# Patient Record
Sex: Female | Born: 1975
Health system: Southern US, Community
[De-identification: ages and names within clinical notes are randomized; demographics above are authoritative.]

## PROBLEM LIST (undated history)

## (undated) DIAGNOSIS — K219 Gastro-esophageal reflux disease without esophagitis: Secondary | ICD-10-CM

## (undated) DIAGNOSIS — G43909 Migraine, unspecified, not intractable, without status migrainosus: Secondary | ICD-10-CM

## (undated) DIAGNOSIS — K640 First degree hemorrhoids: Secondary | ICD-10-CM

## (undated) DIAGNOSIS — A4902 Methicillin resistant Staphylococcus aureus infection, unspecified site: Secondary | ICD-10-CM

## (undated) DIAGNOSIS — D649 Anemia, unspecified: Secondary | ICD-10-CM

## (undated) DIAGNOSIS — B9689 Other specified bacterial agents as the cause of diseases classified elsewhere: Secondary | ICD-10-CM

## (undated) DIAGNOSIS — F419 Anxiety disorder, unspecified: Secondary | ICD-10-CM

## (undated) DIAGNOSIS — F633 Trichotillomania: Secondary | ICD-10-CM

## (undated) DIAGNOSIS — N760 Acute vaginitis: Secondary | ICD-10-CM

## (undated) DIAGNOSIS — I10 Essential (primary) hypertension: Secondary | ICD-10-CM

## (undated) DIAGNOSIS — A6000 Herpesviral infection of urogenital system, unspecified: Secondary | ICD-10-CM

## (undated) HISTORY — DX: Essential (primary) hypertension: I10

## (undated) HISTORY — DX: Acute vaginitis: N76.0

## (undated) HISTORY — DX: Migraine, unspecified, not intractable, without status migrainosus: G43.909

## (undated) HISTORY — PX: TUBAL LIGATION: SHX77

## (undated) HISTORY — DX: Trichotillomania: F63.3

## (undated) HISTORY — DX: Other specified bacterial agents as the cause of diseases classified elsewhere: B96.89

## (undated) HISTORY — DX: First degree hemorrhoids: K64.0

## (undated) HISTORY — PX: LEEP: SHX91

## (undated) HISTORY — DX: Herpesviral infection of urogenital system, unspecified: A60.00

## (undated) HISTORY — DX: Anemia, unspecified: D64.9

---

## 2006-11-03 ENCOUNTER — Ambulatory Visit: Payer: Self-pay | Admitting: Obstetrics & Gynecology

## 2006-11-04 ENCOUNTER — Inpatient Hospital Stay: Payer: Self-pay | Admitting: Obstetrics & Gynecology

## 2006-11-04 DIAGNOSIS — O321XX0 Maternal care for breech presentation, not applicable or unspecified: Secondary | ICD-10-CM

## 2009-10-20 ENCOUNTER — Emergency Department: Payer: Self-pay

## 2010-04-09 ENCOUNTER — Observation Stay: Payer: Self-pay | Admitting: Obstetrics and Gynecology

## 2010-04-21 HISTORY — PX: TUBAL LIGATION: SHX77

## 2010-04-30 ENCOUNTER — Ambulatory Visit: Payer: Self-pay | Admitting: Obstetrics & Gynecology

## 2010-05-01 ENCOUNTER — Inpatient Hospital Stay: Payer: Self-pay | Admitting: Obstetrics & Gynecology

## 2010-05-02 LAB — PATHOLOGY REPORT

## 2013-12-21 ENCOUNTER — Emergency Department: Payer: Self-pay | Admitting: Emergency Medicine

## 2013-12-21 LAB — URINALYSIS, COMPLETE
Bilirubin,UR: NEGATIVE
Glucose,UR: NEGATIVE mg/dL (ref 0–75)
KETONE: NEGATIVE
NITRITE: POSITIVE
Ph: 7 (ref 4.5–8.0)
Protein: 100
Specific Gravity: 1.018 (ref 1.003–1.030)
Squamous Epithelial: 3
WBC UR: 139 /HPF (ref 0–5)

## 2013-12-21 LAB — PREGNANCY, URINE: PREGNANCY TEST, URINE: NEGATIVE m[IU]/mL

## 2014-01-25 ENCOUNTER — Emergency Department: Payer: Self-pay | Admitting: Emergency Medicine

## 2014-03-02 ENCOUNTER — Emergency Department: Payer: Self-pay | Admitting: Emergency Medicine

## 2016-04-04 ENCOUNTER — Emergency Department
Admission: EM | Admit: 2016-04-04 | Discharge: 2016-04-04 | Disposition: A | Discharge status: Home / Self Care | Payer: No Typology Code available for payment source | Attending: Emergency Medicine | Admitting: Emergency Medicine

## 2016-04-04 ENCOUNTER — Encounter: Payer: Self-pay | Admitting: Emergency Medicine

## 2016-04-04 ENCOUNTER — Emergency Department: Payer: No Typology Code available for payment source

## 2016-04-04 DIAGNOSIS — S0990XA Unspecified injury of head, initial encounter: Secondary | ICD-10-CM | POA: Diagnosis present

## 2016-04-04 DIAGNOSIS — Y999 Unspecified external cause status: Secondary | ICD-10-CM | POA: Insufficient documentation

## 2016-04-04 DIAGNOSIS — Y9389 Activity, other specified: Secondary | ICD-10-CM | POA: Insufficient documentation

## 2016-04-04 DIAGNOSIS — S0003XA Contusion of scalp, initial encounter: Secondary | ICD-10-CM | POA: Insufficient documentation

## 2016-04-04 DIAGNOSIS — S161XXA Strain of muscle, fascia and tendon at neck level, initial encounter: Secondary | ICD-10-CM | POA: Diagnosis not present

## 2016-04-04 DIAGNOSIS — S0093XA Contusion of unspecified part of head, initial encounter: Secondary | ICD-10-CM

## 2016-04-04 DIAGNOSIS — Y92410 Unspecified street and highway as the place of occurrence of the external cause: Secondary | ICD-10-CM | POA: Diagnosis not present

## 2016-04-04 MED ORDER — BACLOFEN 10 MG PO TABS: 10.0000 mg | ORAL_TABLET | Freq: Three times a day (TID) | ORAL | 0 refills | Status: DC

## 2016-04-04 MED ORDER — IBUPROFEN 800 MG PO TABS: 800.0000 mg | ORAL_TABLET | Freq: Three times a day (TID) | ORAL | 0 refills | Status: DC | PRN

## 2016-04-04 MED ORDER — DIAZEPAM 5 MG PO TABS
5.0000 mg | ORAL_TABLET | Freq: Once | ORAL | Status: AC
  Administered 2016-04-04: 5 mg via ORAL

## 2016-04-04 MED ORDER — DIAZEPAM 5 MG PO TABS
ORAL_TABLET | ORAL | Status: AC
  Filled 2016-04-04: qty 1

## 2016-04-04 NOTE — ED Notes (Signed)
Pt discharged home after verbalizing understanding of discharge instructions; nad noted. 
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
## 2016-04-04 NOTE — ED Triage Notes (Signed)
[place]  C/o neck pain and bilat shoulder pain.  c-collar applied.

## 2016-04-17 ENCOUNTER — Ambulatory Visit
Admission: EM | Admit: 2016-04-17 | Discharge: 2016-04-17 | Disposition: A | Discharge status: Home / Self Care | Payer: BLUE CROSS/BLUE SHIELD | Attending: Family Medicine | Admitting: Family Medicine

## 2016-04-17 DIAGNOSIS — S161XXA Strain of muscle, fascia and tendon at neck level, initial encounter: Secondary | ICD-10-CM

## 2016-04-17 DIAGNOSIS — M6248 Contracture of muscle, other site: Secondary | ICD-10-CM

## 2016-04-17 DIAGNOSIS — M62838 Other muscle spasm: Secondary | ICD-10-CM

## 2016-04-17 HISTORY — DX: Anxiety disorder, unspecified: F41.9

## 2016-04-17 MED ORDER — MELOXICAM 15 MG PO TABS: 15.0000 mg | ORAL_TABLET | Freq: Every day | ORAL | 0 refills | Status: DC

## 2016-04-17 MED ORDER — METAXALONE 800 MG PO TABS: 800.0000 mg | ORAL_TABLET | Freq: Three times a day (TID) | ORAL | 0 refills | Status: DC
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
## 2016-06-18 ENCOUNTER — Ambulatory Visit
Admission: EM | Admit: 2016-06-18 | Discharge: 2016-06-18 | Disposition: A | Discharge status: Home / Self Care | Payer: BLUE CROSS/BLUE SHIELD | Attending: Internal Medicine | Admitting: Internal Medicine

## 2016-06-18 DIAGNOSIS — M5412 Radiculopathy, cervical region: Secondary | ICD-10-CM | POA: Diagnosis not present

## 2016-06-18 MED ORDER — PREDNISONE 50 MG PO TABS: 50.0000 mg | ORAL_TABLET | Freq: Every day | ORAL | 0 refills | Status: DC

## 2016-06-18 MED ORDER — NAPROXEN 500 MG PO TABS: 500.0000 mg | ORAL_TABLET | Freq: Two times a day (BID) | ORAL | 0 refills | Status: DC

## 2016-06-18 NOTE — Discharge Instructions (Addendum)
Trial of prednisone (steroid) and naproxen (anti inflammatory) to help with inflammation/pain.  Physical therapy and neck traction may also be helpful.  If not improving in several days would make appointment with spine specialist for further evaluation.
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
## 2016-09-30 ENCOUNTER — Telehealth: Payer: Self-pay

## 2016-09-30 ENCOUNTER — Other Ambulatory Visit: Payer: Self-pay | Admitting: Certified Nurse Midwife

## 2016-09-30 DIAGNOSIS — A6000 Herpesviral infection of urogenital system, unspecified: Secondary | ICD-10-CM | POA: Insufficient documentation

## 2016-09-30 MED ORDER — VALACYCLOVIR HCL 500 MG PO TABS: 500.0000 mg | ORAL_TABLET | Freq: Two times a day (BID) | ORAL | 1 refills | Status: DC

## 2016-09-30 NOTE — Telephone Encounter (Signed)
Pt called stating she has an outbreak of herpes.  Her rx has expired.  [facility] [place]

## 2016-09-30 NOTE — Telephone Encounter (Signed)
I phoned in a RX for Valtrez 500 mgm BID x 3-5 days prn outbreak [facility] [place]

## 2016-10-16 ENCOUNTER — Telehealth: Payer: Self-pay

## 2016-10-16 DIAGNOSIS — N760 Acute vaginitis: Principal | ICD-10-CM

## 2016-10-16 DIAGNOSIS — B9689 Other specified bacterial agents as the cause of diseases classified elsewhere: Secondary | ICD-10-CM | POA: Insufficient documentation

## 2016-10-16 MED ORDER — METRONIDAZOLE 500 MG PO TABS: 500.0000 mg | ORAL_TABLET | Freq: Two times a day (BID) | ORAL | 0 refills | Status: AC

## 2016-10-16 NOTE — Telephone Encounter (Signed)
RN to notify pt Rx eRxd. F/u prn. Annual due if she wants to schedule (looking at May/June).

## 2016-10-16 NOTE — Telephone Encounter (Signed)
Pt calling c another reoccurring bacterial infection.  Can a rx be called in.  She states she is not able to come in today or tomorrow and with the holiday weekend doesn't want it to get worse.

## 2016-10-29 ENCOUNTER — Telehealth: Payer: Self-pay

## 2016-10-29 MED ORDER — SERTRALINE HCL 100 MG PO TABS: 100.0000 mg | ORAL_TABLET | Freq: Every day | ORAL | 0 refills | Status: DC

## 2016-10-29 NOTE — Telephone Encounter (Signed)
Pt has appt 11/22/16 with [facility]

## 2016-11-21 ENCOUNTER — Encounter: Payer: Self-pay | Admitting: Obstetrics and Gynecology

## 2016-11-22 ENCOUNTER — Other Ambulatory Visit: Payer: Self-pay | Admitting: Obstetrics and Gynecology

## 2016-11-22 ENCOUNTER — Telehealth: Payer: Self-pay

## 2016-11-22 ENCOUNTER — Ambulatory Visit (INDEPENDENT_AMBULATORY_CARE_PROVIDER_SITE_OTHER): Payer: BLUE CROSS/BLUE SHIELD | Admitting: Obstetrics and Gynecology

## 2016-11-22 ENCOUNTER — Encounter: Payer: Self-pay | Admitting: Obstetrics and Gynecology

## 2016-11-22 VITALS — BP 138/86 | HR 74 | Ht 65.5 in | Wt 187.0 lb

## 2016-11-22 DIAGNOSIS — Z01419 Encounter for gynecological examination (general) (routine) without abnormal findings: Secondary | ICD-10-CM

## 2016-11-22 DIAGNOSIS — F329 Major depressive disorder, single episode, unspecified: Secondary | ICD-10-CM

## 2016-11-22 DIAGNOSIS — F419 Anxiety disorder, unspecified: Secondary | ICD-10-CM | POA: Diagnosis not present

## 2016-11-22 DIAGNOSIS — Z1151 Encounter for screening for human papillomavirus (HPV): Secondary | ICD-10-CM

## 2016-11-22 DIAGNOSIS — Z124 Encounter for screening for malignant neoplasm of cervix: Secondary | ICD-10-CM

## 2016-11-22 DIAGNOSIS — K219 Gastro-esophageal reflux disease without esophagitis: Secondary | ICD-10-CM

## 2016-11-22 DIAGNOSIS — N760 Acute vaginitis: Secondary | ICD-10-CM | POA: Diagnosis not present

## 2016-11-22 DIAGNOSIS — Z113 Encounter for screening for infections with a predominantly sexual mode of transmission: Secondary | ICD-10-CM

## 2016-11-22 DIAGNOSIS — B9689 Other specified bacterial agents as the cause of diseases classified elsewhere: Secondary | ICD-10-CM | POA: Diagnosis not present

## 2016-11-22 DIAGNOSIS — F32A Depression, unspecified: Secondary | ICD-10-CM | POA: Insufficient documentation

## 2016-11-22 LAB — POCT WET PREP WITH KOH
CLUE CELLS WET PREP PER HPF POC: POSITIVE
KOH Prep POC: POSITIVE — AB
TRICHOMONAS UA: NEGATIVE
Yeast Wet Prep HPF POC: NEGATIVE

## 2016-11-22 MED ORDER — CLINDAMYCIN HCL 300 MG PO CAPS: 300.0000 mg | ORAL_CAPSULE | Freq: Two times a day (BID) | ORAL | 0 refills | Status: AC

## 2016-11-22 MED ORDER — CLINDAMYCIN PHOSPHATE 2 % VA CREA: 1.0000 | TOPICAL_CREAM | Freq: Every day | VAGINAL | 0 refills | Status: DC

## 2016-11-22 MED ORDER — SERTRALINE HCL 100 MG PO TABS: 100.0000 mg | ORAL_TABLET | Freq: Every day | ORAL | 12 refills | Status: DC

## 2016-11-22 MED ORDER — OMEPRAZOLE 20 MG PO CPDR: 20.0000 mg | DELAYED_RELEASE_CAPSULE | Freq: Every day | ORAL | 11 refills | Status: DC

## 2016-11-22 NOTE — Telephone Encounter (Signed)
Clindamycin oral meds eRxd. BID for 7 days. RN to notify pt. F/u if still too expensive.

## 2016-11-22 NOTE — Telephone Encounter (Signed)
Pt aware.

## 2016-11-22 NOTE — Telephone Encounter (Signed)
Pt was seen this am and rx was sent to Pharm that is going to cost $80 c ins.  Is there a pill form or something not as expensive.  [phone]

## 2016-11-22 NOTE — Progress Notes (Signed)
Chief Complaint  Patient presents with  . Gynecologic Exam    vaginal odor/ ?? BV     HPI:      [name] is a 41 y.o. [id number] who LMP was Patient's last menstrual period was 11/15/2016 (exact date)., presents today for her annual examination.  Her menses are regular every 28-30 days, lasting 7 days.  Dysmenorrhea mild, occurring first 1-2 days of flow. She does not have intermenstrual bleeding.  Sex activity: single partner, contraception - tubal ligation. She has a new partner this yr from last yr.  Last Pap: October 02, 2015  Results were: ASCUS with NEGATIVE high risk HPV  Hx of STDs: HSV. She had the first outbreak in a long time a couple months ago. She has a RF on valtrex.   She had a hx of recurrent BV in the past. Sx had resolved for over a yr, but pt is with sex partner now that she was with when she got recurrent sx in the past. She was not with him when her sx stopped. She was treated with flagyl for BV about a month ago. Pt states sx have recurred since LMP with increased d/c and odor.   There is no FH of breast cancer. There is no FH of ovarian cancer. The patient does not do self-breast exams.  Tobacco use: The patient denies current or previous tobacco use. Alcohol use: none Exercise: moderately active  She does get adequate calcium and Vitamin D in her diet.  She has noticed GERD sx recently. She has taken her dad's omeprazole with sx relief. She denies any triggers. Sx are daily. She would like a Rx.   She takes sertraline 100 mg daily for anxiety/OCD tendencies. Rx is helping with sx. She denies any side effects and wants to cont.   Active Ambulatory Problems    Diagnosis Date Noted  . Herpes genitalis 09/30/2016  . BV (bacterial vaginosis) 10/16/2016  . Anxiety    Resolved Ambulatory Problems    Diagnosis Date Noted  . No Resolved Ambulatory Problems   Past Medical History:  Diagnosis Date  . Anemia   . Anxiety   . BV (bacterial vaginosis)     . First degree hemorrhoids   . Herpes genitalis   . Migraine      Past Medical History:  Diagnosis Date  . Anemia   . Anxiety   . BV (bacterial vaginosis)   . First degree hemorrhoids   . Herpes genitalis   . Migraine     Past Surgical History:  Procedure Laterality Date  . CESAREAN SECTION    . LEEP    . TUBAL LIGATION      Family History  Problem Relation Age of Onset  . Diabetes Mother   . Gout Mother   . Hypertension Mother   . Hypertension Father   . Bone cancer Maternal Grandmother   . Diabetes Paternal Grandfather   . Heart disease Paternal Grandfather     Social History   Social History  . Marital status: Legally Separated    Spouse name: N/A  . Number of children: N/A  . Years of education: N/A   Occupational History  . Not on file.   Social History Main Topics  . Smoking status: Never Smoker  . Smokeless tobacco: Never Used  . Alcohol use No  . Drug use: No  . Sexual activity: Yes    Birth control/ protection: Surgical     Comment: BTL  Other Topics Concern  . Not on file   Social History Narrative  . No narrative on file     Current Outpatient Prescriptions:  .  sertraline (ZOLOFT) 100 MG tablet, Take 1 tablet (100 mg total) by mouth daily., Disp: 30 tablet, Rfl: 12 .  valACYclovir (VALTREX) 500 MG tablet, Take 1 tablet (500 mg total) by mouth 2 (two) times daily. For 3-5 days prn outbreak, Disp: 30 tablet, Rfl: 1 .  clindamycin (CLEOCIN) 2 % vaginal cream, Place 1 Applicatorful vaginally at bedtime., Disp: 40 g, Rfl: 0 .  omeprazole (PRILOSEC) 20 MG capsule, Take 1 capsule (20 mg total) by mouth daily., Disp: 30 capsule, Rfl: 11  ROS:  Review of Systems  Constitutional: Negative for fever, malaise/fatigue and weight loss.  HENT: Negative for congestion, ear pain and sinus pain.   Respiratory: Negative for cough, shortness of breath and wheezing.   Cardiovascular: Negative for chest pain, orthopnea and leg swelling.   Gastrointestinal: Positive for constipation. Negative for diarrhea, nausea and vomiting.  Genitourinary: Negative for dysuria, frequency, hematuria and urgency.       Breast ROS: negative   Musculoskeletal: Negative for back pain, joint pain and myalgias.  Skin: Negative for itching and rash.  Neurological: Negative for dizziness, tingling, focal weakness and headaches.  Endo/Heme/Allergies: Negative for environmental allergies. Does not bruise/bleed easily.  Psychiatric/Behavioral: Negative for depression and suicidal ideas. The patient is nervous/anxious. The patient does not have insomnia.     Objective: BP 138/86 (BP Location: Left Arm, Patient Position: Sitting, Cuff Size: Normal)   Pulse 74   Ht 5' 5.5" (1.664 m)   Wt 187 lb (84.8 kg)   LMP 11/15/2016 (Exact Date)   BMI 30.65 kg/m    Physical Exam  Constitutional: She is oriented to person, place, and time. She appears well-developed and well-nourished.  Genitourinary: Vagina normal and uterus normal. There is no rash or tenderness on the right labia. There is no rash or tenderness on the left labia. No erythema or tenderness in the vagina. No vaginal discharge found. Right adnexum does not display mass and does not display tenderness. Left adnexum does not display mass and does not display tenderness. Cervix does not exhibit motion tenderness or polyp. Uterus is not enlarged or tender.  Neck: Normal range of motion. No thyromegaly present.  Cardiovascular: Normal rate, regular rhythm and normal heart sounds.   No murmur heard. Pulmonary/Chest: Effort normal and breath sounds normal. Right breast exhibits no mass, no nipple discharge, no skin change and no tenderness. Left breast exhibits no mass, no nipple discharge, no skin change and no tenderness.  Abdominal: Soft. There is no tenderness. There is no guarding.  Musculoskeletal: Normal range of motion.  Neurological: She is alert and oriented to person, place, and time. No  cranial nerve deficit.  Psychiatric: She has a normal mood and affect. Her behavior is normal.  Vitals reviewed.   Results: Results for orders placed or performed in visit on 11/22/16 (from the past 24 hour(s))  POCT Wet Prep with KOH     Status: Abnormal   Collection Time: 11/22/16 10:13 AM  Result Value Ref Range   Trichomonas, UA Negative    Clue Cells Wet Prep HPF POC pos    Epithelial Wet Prep HPF POC  Few, Moderate, Many, Too numerous to count   Yeast Wet Prep HPF POC neg    Bacteria Wet Prep HPF POC  Few   RBC Wet Prep HPF POC  WBC Wet Prep HPF POC     KOH Prep POC Positive (A) Negative    Assessment/Plan: Encounter for annual routine gynecological examination  Cervical cancer screening - Plan: IGP,CtNgTv,Apt HPV,rfx16/18,45, CANCELED: IGP, Aptima HPV  Screening for HPV (human papillomavirus) - Plan: IGP,CtNgTv,Apt HPV,rfx16/18,45, CANCELED: IGP, Aptima HPV  Screening for STD (sexually transmitted disease) - Plan: IGP,CtNgTv,Apt HPV,rfx16/18,45  Anxiety and depression - Rx RF sertraline. F/u prn. - Plan: sertraline (ZOLOFT) 100 MG tablet  Bacterial vaginosis - Rx cleocin. May need maintenance tx if sx cont to recur. - Plan: POCT Wet Prep with KOH, clindamycin (CLEOCIN) 2 % vaginal cream  Gastroesophageal reflux disease, esophagitis presence not specified - Rx omeprazole daily. F/u prn sx. Diet/wt loss to see if sx improve. - Plan: omeprazole (PRILOSEC) 20 MG capsule             GYN counsel mammography screening, adequate intake of calcium and vitamin D, diet and exercise     F/U  Return in about 1 year (around 11/22/2017).  Havanna Groner B. Mckynlee Luse, PA-C 11/22/2016 10:14 AM

## 2016-11-28 LAB — IGP,CTNGTV,APT HPV,RFX16/18,45
Chlamydia, Nuc. Acid Amp: NEGATIVE
Gonococcus, Nuc. Acid Amp: NEGATIVE
HPV Aptima: NEGATIVE
PAP SMEAR COMMENT: 0
TRICH VAG BY NAA: NEGATIVE

## 2017-01-06 ENCOUNTER — Telehealth: Payer: Self-pay

## 2017-01-06 DIAGNOSIS — N938 Other specified abnormal uterine and vaginal bleeding: Secondary | ICD-10-CM | POA: Insufficient documentation

## 2017-01-06 NOTE — Telephone Encounter (Signed)
Pt complains of menses being a few days early the past 2 months and heavier this month with clots. No mid-cycle bleeding. Having increased cramping. Check labs. If neg, can follow sx. If still persist, will check u/s. Pt had neg gon/chlam at 5/18 annual and no new partners since then.

## 2017-01-06 NOTE — Telephone Encounter (Signed)
Pt has concerns about her cycles.  The last two cycles came early.  Today is the 6th day and heavier that usual.  States cycles are usually easy, and same amt of days.  Please adv.  [phone]

## 2017-01-10 ENCOUNTER — Other Ambulatory Visit: Payer: BLUE CROSS/BLUE SHIELD

## 2017-01-13 ENCOUNTER — Other Ambulatory Visit: Payer: BLUE CROSS/BLUE SHIELD

## 2017-01-13 DIAGNOSIS — N938 Other specified abnormal uterine and vaginal bleeding: Secondary | ICD-10-CM

## 2017-01-14 ENCOUNTER — Telehealth: Payer: Self-pay | Admitting: Obstetrics and Gynecology

## 2017-01-14 LAB — PROLACTIN: PROLACTIN: 17.5 ng/mL (ref 4.8–23.3)

## 2017-01-14 LAB — TSH+FREE T4
Free T4: 0.95 ng/dL (ref 0.82–1.77)
TSH: 0.647 u[IU]/mL (ref 0.450–4.500)

## 2017-01-14 NOTE — Telephone Encounter (Signed)
Pt aware of neg thryoid/prolactin labs. Will cont to follow menses. If heavy bleeding with clots or long periods persist, will check u/s. Pt to f/u prn sx.

## 2017-01-28 ENCOUNTER — Telehealth: Payer: Self-pay

## 2017-01-28 NOTE — Telephone Encounter (Signed)
Pt has been on an antibx for a week for infected tooth which will be pulled tomorrow.  Has a yeast inf - significant amt of d/c and lots of itching.  Call in something or be seen?  Adv to use Monistat 3d or 7d and if still has sxs to call to be seen and not to use it the night before being seen.

## 2017-02-10 ENCOUNTER — Other Ambulatory Visit: Payer: Self-pay | Admitting: Obstetrics and Gynecology

## 2017-02-10 ENCOUNTER — Telehealth: Payer: Self-pay

## 2017-02-10 MED ORDER — METRONIDAZOLE 500 MG PO TABS: ORAL_TABLET | ORAL | 0 refills | Status: DC

## 2017-02-10 NOTE — Telephone Encounter (Signed)
Pt aware via

## 2017-02-10 NOTE — Telephone Encounter (Signed)
Pt sates she developed BV this weekend and would like to know if medication can be called in.

## 2017-02-10 NOTE — Progress Notes (Signed)
Recurrent BV sx. Rx RF flagyl.

## 2017-02-10 NOTE — Telephone Encounter (Signed)
Rx eRxd. RN to notify pt

## 2017-04-30 ENCOUNTER — Other Ambulatory Visit: Payer: Self-pay | Admitting: Obstetrics and Gynecology

## 2017-04-30 ENCOUNTER — Telehealth: Payer: Self-pay

## 2017-04-30 MED ORDER — METRONIDAZOLE 500 MG PO TABS: ORAL_TABLET | ORAL | 0 refills | Status: DC

## 2017-04-30 NOTE — Telephone Encounter (Signed)
Pt states she has yet another bacterial inf.  She just finished her cycle.  [facility] [place]  [phone]  Sxs:  'God awefull rike odor"  Adv would let you know.

## 2017-04-30 NOTE — Progress Notes (Signed)
Rx RF for recurrent BV

## 2017-04-30 NOTE — Telephone Encounter (Signed)
Rx flagyl eRxd.

## 2017-07-01 ENCOUNTER — Other Ambulatory Visit: Payer: Self-pay | Admitting: Obstetrics and Gynecology

## 2017-07-01 ENCOUNTER — Telehealth: Payer: Self-pay

## 2017-07-01 MED ORDER — METRONIDAZOLE 500 MG PO TABS: ORAL_TABLET | ORAL | 0 refills | Status: DC

## 2017-07-01 NOTE — Progress Notes (Signed)
Rx RF for BV 

## 2017-07-01 NOTE — Telephone Encounter (Signed)
Pt wants to know if you can send in BV meds to [facility] [place]

## 2017-07-01 NOTE — Telephone Encounter (Signed)
Rx flagyl eRxd.

## 2017-08-25 ENCOUNTER — Other Ambulatory Visit: Payer: Self-pay | Admitting: Obstetrics and Gynecology

## 2017-08-25 DIAGNOSIS — F32A Depression, unspecified: Secondary | ICD-10-CM

## 2017-08-25 DIAGNOSIS — F329 Major depressive disorder, single episode, unspecified: Secondary | ICD-10-CM

## 2017-08-25 DIAGNOSIS — F419 Anxiety disorder, unspecified: Principal | ICD-10-CM

## 2017-08-25 MED ORDER — SERTRALINE HCL 100 MG PO TABS: 100.0000 mg | ORAL_TABLET | Freq: Every day | ORAL | 0 refills | Status: DC

## 2017-08-25 NOTE — Progress Notes (Signed)
Rx change to 90 day

## 2017-10-30 ENCOUNTER — Other Ambulatory Visit: Payer: Self-pay | Admitting: Obstetrics and Gynecology

## 2017-10-30 DIAGNOSIS — K219 Gastro-esophageal reflux disease without esophagitis: Secondary | ICD-10-CM

## 2017-10-30 MED ORDER — OMEPRAZOLE 20 MG PO CPDR: 20.0000 mg | DELAYED_RELEASE_CAPSULE | Freq: Every day | ORAL | 0 refills | Status: DC

## 2017-10-30 NOTE — Progress Notes (Signed)
Rx RF till annual 

## 2017-11-03 ENCOUNTER — Encounter: Payer: Self-pay | Admitting: Maternal Newborn

## 2017-11-03 ENCOUNTER — Ambulatory Visit: Payer: BLUE CROSS/BLUE SHIELD | Admitting: Maternal Newborn

## 2017-11-03 VITALS — BP 140/100 | HR 72 | Temp 99.1°F | Ht 65.5 in | Wt 199.0 lb

## 2017-11-03 DIAGNOSIS — R300 Dysuria: Secondary | ICD-10-CM | POA: Diagnosis not present

## 2017-11-03 DIAGNOSIS — R319 Hematuria, unspecified: Secondary | ICD-10-CM | POA: Diagnosis not present

## 2017-11-03 DIAGNOSIS — N390 Urinary tract infection, site not specified: Secondary | ICD-10-CM

## 2017-11-03 DIAGNOSIS — R350 Frequency of micturition: Secondary | ICD-10-CM

## 2017-11-03 LAB — POCT URINALYSIS DIPSTICK
BILIRUBIN UA: NEGATIVE
Glucose, UA: NEGATIVE
Ketones, UA: NEGATIVE
NITRITE UA: NEGATIVE
PH UA: 6 (ref 5.0–8.0)
Spec Grav, UA: 1.02 (ref 1.010–1.025)
UROBILINOGEN UA: NEGATIVE U/dL — AB

## 2017-11-03 MED ORDER — CEPHALEXIN 500 MG PO CAPS: 500.0000 mg | ORAL_CAPSULE | Freq: Two times a day (BID) | ORAL | 0 refills | Status: DC

## 2017-11-03 NOTE — Progress Notes (Signed)
Obstetrics & Gynecology Office Visit   Chief Complaint: Painful urination  History of Present Illness: Patient reports a burning sensation when she urinates that began last night. She is also experiencing urinary frequency. She reports that her urine has a bad odor. She has also had  an intermittent tingling/painful sensation in the area of her kidney on the right side that she has been feeling for a week or more. Nothing makes her symptoms better or worse. She was out of town last week and reports drinking a lot of soft drinks, which is unusual for her.  Review of Systems: Patient states that she has GERD, anxiety, and intermittent pain in the center of her chest that she attributes to anxiety/reflux and resolves spontaneously. Otherwise, 10 point review of systems negative unless noted in HPI.  Past Medical History:  Past Medical History:  Diagnosis Date  . Anemia   . Anxiety   . BV (bacterial vaginosis)   . First degree hemorrhoids   . Herpes genitalis   . Migraine     Past Surgical History:  Past Surgical History:  Procedure Laterality Date  . CESAREAN SECTION    . LEEP    . TUBAL LIGATION      Gynecologic History: Patient's last menstrual period was 10/22/2017.  Obstetric History: [id number]  Family History:  Family History  Problem Relation Age of Onset  . Diabetes Mother   . Gout Mother   . Hypertension Mother   . Hypertension Father   . Bone cancer Maternal Grandmother   . Diabetes Paternal Grandfather   . Heart disease Paternal Grandfather     Social History:  Social History   Socioeconomic History  . Marital status: Legally Separated    Spouse name: Not on file  . Number of children: Not on file  . Years of education: Not on file  . Highest education level: Not on file  Occupational History  . Not on file  Social Needs  . Financial resource strain: Not on file  . Food insecurity:    Worry: Not on file    Inability: Not on file  . Transportation  needs:    Medical: Not on file    Non-medical: Not on file  Tobacco Use  . Smoking status: Never Smoker  . Smokeless tobacco: Never Used  Substance and Sexual Activity  . Alcohol use: No  . Drug use: No  . Sexual activity: Yes    Birth control/protection: Surgical    Comment: BTL  Lifestyle  . Physical activity:    Days per week: Not on file    Minutes per session: Not on file  . Stress: Not on file  Relationships  . Social connections:    Talks on phone: Not on file    Gets together: Not on file    Attends religious service: Not on file    Active member of club or organization: Not on file    Attends meetings of clubs or organizations: Not on file    Relationship status: Not on file  . Intimate partner violence:    Fear of current or ex partner: Not on file    Emotionally abused: Not on file    Physically abused: Not on file    Forced sexual activity: Not on file  Other Topics Concern  . Not on file  Social History Narrative  . Not on file    Allergies:  No Known Allergies  Medications: Prior to Admission medications  Medication Sig Start Date End Date Taking? Authorizing Provider  metroNIDAZOLE (FLAGYL) 500 MG tablet Take 2 tabs BID for 7 days 07/01/17   Copland, Alicia B, PA-C  omeprazole (PRILOSEC) 20 MG capsule Take 1 capsule (20 mg total) by mouth daily. 10/30/17   Copland, Ilona Sorrel, PA-C  sertraline (ZOLOFT) 100 MG tablet Take 1 tablet (100 mg total) by mouth daily. 08/25/17   Copland, Ilona Sorrel, PA-C  valACYclovir (VALTREX) 500 MG tablet Take 1 tablet (500 mg total) by mouth 2 (two) times daily. For 3-5 days prn outbreak 09/30/16   Farrel Conners, CNM    Physical Exam Vitals:  Vitals:   11/03/17 1408  BP: (!) 140/100  Pulse: 72  Temp: 99.1 F (37.3 C)   Patient's last menstrual period was 10/22/2017.  General: NAD HEENT: normocephalic, anicteric Pulmonary: No increased work of breathing Cardiovascular: Regular rate Genitourinary: No CVAT, pelvic  exam deferred Neurologic: Grossly intact Psychiatric: mood appropriate, affect full  Assessment: 42 y.o. [id number] with symptoms of a urinary tract infection and positive UA.  Plan: Problem List Items Addressed This Visit    None    Visit Diagnoses    Burning with urination    -  Primary   Relevant Orders   POCT urinalysis dipstick (Completed)   Urine Culture   Urinary frequency       Relevant Orders   Urine Culture   Urinary tract infection with hematuria, site unspecified       Relevant Medications   cephALEXin (KEFLEX) 500 MG capsule     UA showed hematuria and leukocytes. Urine culture sent to lab. Rx for Keflex sent to pharmacy; will notify patient if change in therapy is warranted based on results of urine culture.  Also advised to follow-up with PCP due to elevated blood pressure at today's visit and patient voiced understanding.  Marcelyn Bruins, CNM 11/03/2017  4:19 PM

## 2017-11-07 LAB — URINE CULTURE

## 2018-01-29 ENCOUNTER — Other Ambulatory Visit: Payer: Self-pay | Admitting: Obstetrics and Gynecology

## 2018-01-29 DIAGNOSIS — K219 Gastro-esophageal reflux disease without esophagitis: Secondary | ICD-10-CM

## 2018-01-29 NOTE — Telephone Encounter (Signed)
Please advise 

## 2018-02-03 NOTE — Telephone Encounter (Signed)
Pt reports she scheduled an AE w/[facility] 03/04/18. She is requesting a refill on her omeprazole be sent to [facility].

## 2018-02-21 ENCOUNTER — Other Ambulatory Visit: Payer: Self-pay | Admitting: Obstetrics and Gynecology

## 2018-02-21 DIAGNOSIS — K219 Gastro-esophageal reflux disease without esophagitis: Secondary | ICD-10-CM

## 2018-03-02 ENCOUNTER — Encounter: Payer: Self-pay | Admitting: Obstetrics and Gynecology

## 2018-03-02 ENCOUNTER — Ambulatory Visit (INDEPENDENT_AMBULATORY_CARE_PROVIDER_SITE_OTHER): Payer: BLUE CROSS/BLUE SHIELD | Admitting: Obstetrics and Gynecology

## 2018-03-02 VITALS — BP 150/104 | HR 81 | Ht 65.5 in | Wt 204.0 lb

## 2018-03-02 DIAGNOSIS — Z1231 Encounter for screening mammogram for malignant neoplasm of breast: Secondary | ICD-10-CM | POA: Diagnosis not present

## 2018-03-02 DIAGNOSIS — A6000 Herpesviral infection of urogenital system, unspecified: Secondary | ICD-10-CM

## 2018-03-02 DIAGNOSIS — Z01419 Encounter for gynecological examination (general) (routine) without abnormal findings: Secondary | ICD-10-CM

## 2018-03-02 DIAGNOSIS — F419 Anxiety disorder, unspecified: Secondary | ICD-10-CM

## 2018-03-02 DIAGNOSIS — R030 Elevated blood-pressure reading, without diagnosis of hypertension: Secondary | ICD-10-CM

## 2018-03-02 DIAGNOSIS — K219 Gastro-esophageal reflux disease without esophagitis: Secondary | ICD-10-CM

## 2018-03-02 DIAGNOSIS — Z1239 Encounter for other screening for malignant neoplasm of breast: Secondary | ICD-10-CM

## 2018-03-02 DIAGNOSIS — F32A Depression, unspecified: Secondary | ICD-10-CM

## 2018-03-02 DIAGNOSIS — F329 Major depressive disorder, single episode, unspecified: Secondary | ICD-10-CM

## 2018-03-02 MED ORDER — VALACYCLOVIR HCL 500 MG PO TABS: 500.0000 mg | ORAL_TABLET | Freq: Two times a day (BID) | ORAL | 1 refills | Status: DC

## 2018-03-02 MED ORDER — SERTRALINE HCL 100 MG PO TABS: ORAL_TABLET | ORAL | 0 refills | Status: DC

## 2018-03-02 MED ORDER — OMEPRAZOLE 20 MG PO CPDR: DELAYED_RELEASE_CAPSULE | ORAL | 3 refills | Status: DC

## 2018-03-02 NOTE — Progress Notes (Addendum)
Chief Complaint  Patient presents with  . Gynecologic Exam    Mammo, talk about anxiety      HPI:      [name] is a 42 y.o. [id number] who LMP was Patient's last menstrual period was 01/27/2018., presents today for her annual examination.  Her menses are regular every 28-30 days, lasting 7 days.  Dysmenorrhea mild, occurring first 1-2 days of flow. She does not have intermenstrual bleeding.  Sex activity: not sex active: contraception - tubal ligation.  Last Pap: 11/22/16  Results were: neg cells, neg HPV DNA.  Hx of STDs: HSV. No recent sx this yr. She needs a RF on valtrex prn.   She had a hx of recurrent BV in the past. Sx occur when sex active, but no sx if not active.  There is no FH of breast cancer. There is no FH of ovarian cancer. The patient does not do self-breast exams.  Tobacco use: The patient denies current or previous tobacco use. Alcohol use: social Exercise: moderately active  She does get adequate calcium but not Vitamin D in her diet.  She has GERD sx. She takes omeprazole 20 mg daily with sx relief. Needs Rx RF.   She takes sertraline 100 mg daily for anxiety/OCD tendencies. Pt having increased anxiety sx now. Fam stressors currently/single mom. Not sleeping well, working 2 jobs. Is pulling hair out on her head due to stress.   Had elevated blood pressure of 140/100 4/19 visit for UTI. Elevated BP again today. Doesn't check it at home. Has strong FH in her parents and mat and pat aunts/uncles. Has occas dizziness episodes, but no increased headaches/vision changes.    Active Ambulatory Problems    Diagnosis Date Noted  . BV (bacterial vaginosis) 10/16/2016  . Anxiety and depression   . DUB (dysfunctional uterine bleeding) 01/06/2017   Resolved Ambulatory Problems    Diagnosis Date Noted  . Herpes genitalis 09/30/2016  . Elevated blood pressure reading in office without diagnosis of hypertension 03/02/2018   Past Medical History:  Diagnosis  Date  . Anemia   . Anxiety   . First degree hemorrhoids   . Migraine      Past Medical History:  Diagnosis Date  . Anemia   . Anxiety   . BV (bacterial vaginosis)   . First degree hemorrhoids   . Herpes genitalis   . Migraine     Past Surgical History:  Procedure Laterality Date  . CESAREAN SECTION    . LEEP    . TUBAL LIGATION      Family History  Problem Relation Age of Onset  . Diabetes Mother   . Gout Mother   . Hypertension Mother   . Hypertension Father   . Bone cancer Maternal Grandmother   . Diabetes Paternal Grandfather   . Heart disease Paternal Grandfather     Social History   Socioeconomic History  . Marital status: Legally Separated    Spouse name: Not on file  . Number of children: Not on file  . Years of education: Not on file  . Highest education level: Not on file  Occupational History  . Not on file  Social Needs  . Financial resource strain: Not on file  . Food insecurity:    Worry: Not on file    Inability: Not on file  . Transportation needs:    Medical: Not on file    Non-medical: Not on file  Tobacco Use  . Smoking status:  Never Smoker  . Smokeless tobacco: Never Used  Substance and Sexual Activity  . Alcohol use: No  . Drug use: No  . Sexual activity: Not Currently    Birth control/protection: Surgical    Comment: BTL  Lifestyle  . Physical activity:    Days per week: Not on file    Minutes per session: Not on file  . Stress: Not on file  Relationships  . Social connections:    Talks on phone: Not on file    Gets together: Not on file    Attends religious service: Not on file    Active member of club or organization: Not on file    Attends meetings of clubs or organizations: Not on file    Relationship status: Not on file  . Intimate partner violence:    Fear of current or ex partner: Not on file    Emotionally abused: Not on file    Physically abused: Not on file    Forced sexual activity: Not on file  Other  Topics Concern  . Not on file  Social History Narrative  . Not on file     Current Outpatient Medications:  .  omeprazole (PRILOSEC) 20 MG capsule, TAKE 1 CAPSULE BY MOUTH EVERY DAY, Disp: 90 capsule, Rfl: 3 .  sertraline (ZOLOFT) 100 MG tablet, Take 1 1/2 tabs daily, Disp: 45 tablet, Rfl: 0 .  valACYclovir (VALTREX) 500 MG tablet, Take 1 tablet (500 mg total) by mouth 2 (two) times daily. For 3-5 days prn outbreak, Disp: 30 tablet, Rfl: 1  ROS:  Review of Systems  Constitutional: Negative for fever, malaise/fatigue and weight loss.  HENT: Negative for congestion, ear pain and sinus pain.   Respiratory: Negative for cough, shortness of breath and wheezing.   Cardiovascular: Negative for chest pain, orthopnea and leg swelling.  Gastrointestinal: Positive for constipation. Negative for diarrhea, nausea and vomiting.  Genitourinary: Negative for dysuria, frequency, hematuria and urgency.       Breast ROS: negative   Musculoskeletal: Negative for back pain, joint pain and myalgias.  Skin: Negative for itching and rash.  Neurological: Negative for dizziness, tingling, focal weakness and headaches.  Endo/Heme/Allergies: Negative for environmental allergies. Does not bruise/bleed easily.  Psychiatric/Behavioral: Negative for depression and suicidal ideas. The patient is nervous/anxious. The patient does not have insomnia.     Objective: BP (!) 150/104   Pulse 81   Ht 5' 5.5" (1.664 m)   Wt 204 lb (92.5 kg)   LMP 01/27/2018   BMI 33.43 kg/m    Physical Exam  Constitutional: She is oriented to person, place, and time. She appears well-developed and well-nourished.  Genitourinary: Vagina normal and uterus normal. There is no rash or tenderness on the right labia. There is no rash or tenderness on the left labia. No erythema or tenderness in the vagina. No vaginal discharge found. Right adnexum does not display mass and does not display tenderness. Left adnexum does not display mass  and does not display tenderness. Cervix does not exhibit motion tenderness or polyp. Uterus is not enlarged or tender.  Neck: Normal range of motion. No thyromegaly present.  Cardiovascular: Normal rate, regular rhythm and normal heart sounds.  No murmur heard. Pulmonary/Chest: Effort normal and breath sounds normal. Right breast exhibits no mass, no nipple discharge, no skin change and no tenderness. Left breast exhibits no mass, no nipple discharge, no skin change and no tenderness.  Abdominal: Soft. There is no tenderness. There is no guarding.  Musculoskeletal: Normal range of motion.  Neurological: She is alert and oriented to person, place, and time. No cranial nerve deficit.  Psychiatric: She has a normal mood and affect. Her behavior is normal.  Vitals reviewed.  RESULTS: [facility]/02/2018  Decreased Interest 0  Down, Depressed, Hopeless 0  PHQ - 2 Score 0  Altered sleeping 1  Tired, decreased energy 1  Change in appetite 1  Feeling bad or failure about yourself  0  Trouble concentrating 1  Moving slowly or fidgety/restless 0  Suicidal thoughts 0  PHQ-9 Score 4  Difficult doing work/chores Not difficult at all    GAD 7 : Generalized Anxiety Score 03/02/2018  Nervous, Anxious, on Edge 3  Control/stop worrying 3  Worry too much - different things 2  Trouble relaxing 2  Restless 2  Easily annoyed or irritable 1  Afraid - awful might happen 1  Total GAD 7 Score 14  Anxiety Difficulty Somewhat difficult    Assessment/Plan: Encounter for annual routine gynecological examination  Screening for breast cancer - Pt to sched mammo - Plan: MM DIGITAL SCREENING BILATERAL  Gastroesophageal reflux disease, esophagitis presence not specified - Rx RF omeprazole.  - Plan: omeprazole (PRILOSEC) 20 MG capsule  Anxiety and depression - Rx increase zoloft to 1 1/2 tabs daily. F/u at 1 wk BP chk/4 wks via phone. If not relief, will change to different SSRI.  Exercise/stress mgmt/time for herself - Plan: sertraline (ZOLOFT) 100 MG tablet  Elevated blood pressure reading in office without diagnosis of hypertension - RTO in 1 wk to rechk. If still elevated, will start HTN med.  Genital herpes simplex, unspecified site - Rx RF valtrex. F/u prn. - Plan: valACYclovir (VALTREX) 500 MG tablet             GYN counsel mammography screening, adequate intake of calcium and vitamin D, diet and exercise     F/U  Return in about 1 week (around 03/09/2018) for BP rechk.  Sarahbeth Cashin B. Candace Ramus, PA-C 03/02/2018 10:51 AM

## 2018-03-02 NOTE — Patient Instructions (Signed)
I value your feedback and entrusting us with your care. If you get a [facility] patient survey, I would appreciate you taking the time to let us know about your experience today. Thank you!  [facility]: [phone]  [facility]: [phone]  

## 2018-03-03 ENCOUNTER — Other Ambulatory Visit: Payer: Self-pay | Admitting: Obstetrics and Gynecology

## 2018-03-03 DIAGNOSIS — F419 Anxiety disorder, unspecified: Principal | ICD-10-CM

## 2018-03-03 DIAGNOSIS — F32A Depression, unspecified: Secondary | ICD-10-CM

## 2018-03-03 DIAGNOSIS — F329 Major depressive disorder, single episode, unspecified: Secondary | ICD-10-CM

## 2018-03-04 ENCOUNTER — Ambulatory Visit: Payer: BLUE CROSS/BLUE SHIELD | Admitting: Obstetrics and Gynecology

## 2018-03-09 ENCOUNTER — Encounter: Payer: Self-pay | Admitting: Obstetrics and Gynecology

## 2018-03-09 ENCOUNTER — Ambulatory Visit (INDEPENDENT_AMBULATORY_CARE_PROVIDER_SITE_OTHER): Payer: BLUE CROSS/BLUE SHIELD | Admitting: Obstetrics and Gynecology

## 2018-03-09 VITALS — BP 132/90 | HR 83 | Ht 65.5 in | Wt 205.0 lb

## 2018-03-09 DIAGNOSIS — K219 Gastro-esophageal reflux disease without esophagitis: Secondary | ICD-10-CM

## 2018-03-09 DIAGNOSIS — Z0130 Encounter for examination of blood pressure without abnormal findings: Secondary | ICD-10-CM | POA: Diagnosis not present

## 2018-03-09 MED ORDER — OMEPRAZOLE 40 MG PO CPDR: DELAYED_RELEASE_CAPSULE | ORAL | 3 refills | Status: DC

## 2018-03-09 NOTE — Progress Notes (Signed)
Chief Complaint  Patient presents with  . Follow-up    bp check     HPI:      [name] is a 42 y.o. [id number] who LMP was Patient's last menstrual period was 02/15/2018 (approximate)., presents today for her BP check. She had an elevated blood pressure at her last appt 03/02/18. BP was 150/104. She does not have a hx of HTN but has had 2 elevated blood pressure readings recently. FH of HTN. Needs BP chck today.    Pt also needs to increase omeprazole to 40 mg QD from 20 mg. Having sx at night. Has had increased sx with increased wt.   Past Medical History:  Diagnosis Date  . Anemia   . Anxiety   . BV (bacterial vaginosis)   . First degree hemorrhoids   . Herpes genitalis   . Migraine     Past Surgical History:  Procedure Laterality Date  . CESAREAN SECTION    . LEEP    . TUBAL LIGATION      Family History  Problem Relation Age of Onset  . Diabetes Mother   . Gout Mother   . Hypertension Mother   . Hypertension Father   . Bone cancer Maternal Grandmother   . Diabetes Paternal Grandfather   . Heart disease Paternal Grandfather     Current Outpatient Medications on File Prior to Visit  Medication Sig Dispense Refill  . sertraline (ZOLOFT) 100 MG tablet Take 1 1/2 tabs daily 45 tablet 0  . valACYclovir (VALTREX) 500 MG tablet Take 1 tablet (500 mg total) by mouth 2 (two) times daily. For 3-5 days prn outbreak 30 tablet 1   No current facility-administered medications on file prior to visit.     Review of Systems  Constitutional: Positive for malaise/fatigue. Negative for fever and weight loss.  Gastrointestinal: Positive for constipation. Negative for blood in stool, diarrhea, nausea and vomiting.  Genitourinary: Negative for dysuria, flank pain, frequency, hematuria and urgency.  Musculoskeletal: Negative for back pain.  Skin: Negative for itching and rash.  Neurological: Positive for dizziness and headaches.    BP 132/90   Pulse 83   Ht 5' 5.5"  (1.664 m)   Wt 205 lb (93 kg)   LMP 02/15/2018 (Approximate)   BMI 33.59 kg/m   Physical Exam  Constitutional: She is oriented to person, place, and time. She appears well-developed.  Neck: Normal range of motion.  Pulmonary/Chest: Effort normal.  Musculoskeletal: Normal range of motion.  Neurological: She is alert and oriented to person, place, and time. No cranial nerve deficit.  Psychiatric: She has a normal mood and affect. Her behavior is normal. Judgment and thought content normal.  Vitals reviewed.   ASSESSMENT/PLAN: Blood pressure check - BP better today. Diet/exercise/wt loss changes/DASH diet. Pt to keep BP check at home. F/u if >140/90 for Rx.  Gastroesophageal reflux disease, esophagitis presence not specified - Increase dose to 40 mg. Rx eRxd. Diet/wt loss changes. F/u prn. - Plan: omeprazole (PRILOSEC) 40 MG capsule   Meds ordered this encounter  Medications  . omeprazole (PRILOSEC) 40 MG capsule    Sig: TAKE 1 CAPSULE BY MOUTH EVERY DAY    Dispense:  90 capsule    Refill:  3    DX Code Needed  .    Order Specific Question:   Supervising Provider    Answer:   Nadara MustardHARRIS, ROBERT P [[id number]]     Return if symptoms worsen or fail to improve.  Henrique Parekh B. Sidda Humm, PA-C 03/09/2018 2:38 PM

## 2018-03-09 NOTE — Patient Instructions (Signed)
I value your feedback and entrusting us with your care. If you get a Danielle Andrews patient survey, I would appreciate you taking the time to let us know about your experience today. Thank you! 

## 2018-03-22 DIAGNOSIS — I10 Essential (primary) hypertension: Secondary | ICD-10-CM | POA: Insufficient documentation

## 2018-03-22 HISTORY — DX: Essential (primary) hypertension: I10

## 2018-03-25 ENCOUNTER — Encounter: Payer: Self-pay | Admitting: Obstetrics and Gynecology

## 2018-03-25 ENCOUNTER — Other Ambulatory Visit: Payer: Self-pay | Admitting: Obstetrics and Gynecology

## 2018-03-25 DIAGNOSIS — F329 Major depressive disorder, single episode, unspecified: Secondary | ICD-10-CM

## 2018-03-25 DIAGNOSIS — F419 Anxiety disorder, unspecified: Principal | ICD-10-CM

## 2018-03-25 DIAGNOSIS — F32A Depression, unspecified: Secondary | ICD-10-CM

## 2018-03-25 MED ORDER — SERTRALINE HCL 100 MG PO TABS: ORAL_TABLET | ORAL | 3 refills | Status: DC

## 2018-03-25 NOTE — Telephone Encounter (Signed)
Please advise 

## 2018-03-25 NOTE — Telephone Encounter (Signed)
Pt aware.

## 2018-03-25 NOTE — Progress Notes (Signed)
Rx RF. Doing well on increased dose. F/u prn

## 2018-04-17 ENCOUNTER — Encounter: Payer: Self-pay | Admitting: Obstetrics and Gynecology

## 2018-04-20 ENCOUNTER — Encounter: Payer: Self-pay | Admitting: Obstetrics and Gynecology

## 2018-04-20 ENCOUNTER — Other Ambulatory Visit: Payer: Self-pay | Admitting: Obstetrics and Gynecology

## 2018-04-20 DIAGNOSIS — I10 Essential (primary) hypertension: Secondary | ICD-10-CM

## 2018-04-20 MED ORDER — LISINOPRIL 10 MG PO TABS
10.0000 mg | ORAL_TABLET | Freq: Every day | ORAL | 0 refills | Status: DC
Start: 2018-04-20 — End: 2018-05-19

## 2018-04-20 NOTE — Progress Notes (Signed)
Rx lisinopril for new onset HTN. RTO in 2 wks for BP f/u.

## 2018-05-04 ENCOUNTER — Encounter: Payer: Self-pay | Admitting: Obstetrics and Gynecology

## 2018-05-07 ENCOUNTER — Encounter: Payer: Self-pay | Admitting: Obstetrics and Gynecology

## 2018-05-13 ENCOUNTER — Other Ambulatory Visit: Payer: Self-pay | Admitting: Obstetrics and Gynecology

## 2018-05-13 DIAGNOSIS — I10 Essential (primary) hypertension: Secondary | ICD-10-CM

## 2018-05-13 NOTE — Telephone Encounter (Signed)
Please advise 

## 2018-05-19 ENCOUNTER — Ambulatory Visit (INDEPENDENT_AMBULATORY_CARE_PROVIDER_SITE_OTHER): Payer: BLUE CROSS/BLUE SHIELD | Admitting: Obstetrics and Gynecology

## 2018-05-19 ENCOUNTER — Encounter: Payer: Self-pay | Admitting: Obstetrics and Gynecology

## 2018-05-19 VITALS — BP 132/82 | HR 79 | Ht 65.5 in | Wt 207.0 lb

## 2018-05-19 DIAGNOSIS — I10 Essential (primary) hypertension: Secondary | ICD-10-CM | POA: Diagnosis not present

## 2018-05-19 DIAGNOSIS — Z713 Dietary counseling and surveillance: Secondary | ICD-10-CM

## 2018-05-19 DIAGNOSIS — F5101 Primary insomnia: Secondary | ICD-10-CM | POA: Diagnosis not present

## 2018-05-19 MED ORDER — LISINOPRIL 10 MG PO TABS: 10.0000 mg | ORAL_TABLET | Freq: Every day | ORAL | 2 refills | Status: DC

## 2018-05-19 NOTE — Progress Notes (Signed)
Chief Complaint  Patient presents with  . Follow-up    on medication and BP check, concerned abt weight gain     HPI:      [name] is a 42 y.o. [id number] who LMP was Patient's last menstrual period was 04/14/2018 (approximate)., presents today for her BP check. She had an elevated blood pressure at her last 2 appts 8/19 and again at recheck at home. Was 150/104 03/02/18 and 132/90 03/09/18. Pt then took at home when relaxed and it was 142/98 9/19. Pt was having occas dizziness, headaches (but has hx of frequent headaches anyway).  Pt started on lisinopril 10 mg 04/20/18. Here for BP check today. Doing well. No side effects/ACEI cough. No dizziness/headaches. Hasn't checked Bps at home since.   Pt also concerned about wt gain. Is very active at work but no diet changes. Has gained 2# since early 8/19. Pt also having trouble staying asleep at night. Not related to having to urinate, worry. Drinks caffeine in afternoons.   Past Medical History:  Diagnosis Date  . Anemia   . Anxiety   . BV (bacterial vaginosis)   . First degree hemorrhoids   . Herpes genitalis   . Hypertension 03/2018  . Migraine     Past Surgical History:  Procedure Laterality Date  . CESAREAN SECTION    . LEEP    . TUBAL LIGATION      Family History  Problem Relation Age of Onset  . Diabetes Mother   . Gout Mother   . Hypertension Mother   . Hypertension Father   . Bone cancer Maternal Grandmother   . Diabetes Paternal Grandfather   . Heart disease Paternal Grandfather     Current Outpatient Medications on File Prior to Visit  Medication Sig Dispense Refill  . omeprazole (PRILOSEC) 40 MG capsule TAKE 1 CAPSULE BY MOUTH EVERY DAY 90 capsule 3  . sertraline (ZOLOFT) 100 MG tablet Take 1 1/2 tabs daily 135 tablet 3  . valACYclovir (VALTREX) 500 MG tablet Take 1 tablet (500 mg total) by mouth 2 (two) times daily. For 3-5 days prn outbreak 30 tablet 1   No current facility-administered medications  on file prior to visit.     Review of Systems  Constitutional: Positive for malaise/fatigue. Negative for fever and weight loss.  HENT: Negative for congestion, ear pain and sinus pain.   Respiratory: Negative for cough, shortness of breath and wheezing.   Cardiovascular: Negative for chest pain, orthopnea and leg swelling.  Gastrointestinal: Negative for constipation, diarrhea, nausea and vomiting.  Genitourinary: Negative for dysuria, frequency, hematuria and urgency.       Breast ROS: negative   Musculoskeletal: Negative for back pain, joint pain and myalgias.  Skin: Negative for itching and rash.  Neurological: Positive for headaches. Negative for dizziness, tingling and focal weakness.  Endo/Heme/Allergies: Negative for environmental allergies. Does not bruise/bleed easily.  Psychiatric/Behavioral: Negative for depression and suicidal ideas. The patient is not nervous/anxious and does not have insomnia.     BP 132/82   Pulse 79   Ht 5' 5.5" (1.664 m)   Wt 207 lb (93.9 kg)   LMP 04/14/2018 (Approximate)   BMI 33.92 kg/m   Physical Exam  Constitutional: She is oriented to person, place, and time. She appears well-developed.  Neck: Normal range of motion.  Pulmonary/Chest: Effort normal.  Musculoskeletal: Normal range of motion.  Neurological: She is alert and oriented to person, place, and time. No cranial nerve deficit.  Psychiatric:  She has a normal mood and affect. Her behavior is normal.    ASSESSMENT/PLAN:  Essential hypertension - Doing well with lisinopril. No side effects. Rx RF. Check BPs at home periodically. F/u if >130/90.  - Plan: lisinopril (PRINIVIL) 10 MG tablet  Weight loss counseling, encounter for - [employer] app. Pt already very active. F/u prn.   Primary insomnia - Discussed no caffeine after 12 noon. Add melatonin/sleep hygiene. F/u prn.     Meds ordered this encounter  Medications  . lisinopril (PRINIVIL) 10 MG tablet    Sig: Take 1  tablet (10 mg total) by mouth daily.    Dispense:  90 tablet    Refill:  2    Order Specific Question:   Supervising Provider    Answer:   Nadara Mustard [[id number]]     Return if symptoms worsen or fail to improve. /8/20 annual   Danielle Haley B. Ivory Bail, PA-C 05/19/2018 9:15 AM

## 2018-05-19 NOTE — Patient Instructions (Signed)
I value your feedback and entrusting us with your care. If you get a [facility] patient survey, I would appreciate you taking the time to let us know about your experience today. Thank you! 

## 2018-05-25 ENCOUNTER — Ambulatory Visit
Admission: RE | Admit: 2018-05-25 | Discharge: 2018-05-25 | Disposition: A | Discharge status: Home / Self Care | Payer: BLUE CROSS/BLUE SHIELD | Source: Ambulatory Visit | Attending: Obstetrics and Gynecology | Admitting: Obstetrics and Gynecology

## 2018-05-25 ENCOUNTER — Encounter: Payer: Self-pay | Admitting: Obstetrics and Gynecology

## 2018-05-25 DIAGNOSIS — Z1239 Encounter for other screening for malignant neoplasm of breast: Secondary | ICD-10-CM | POA: Diagnosis present

## 2018-06-24 ENCOUNTER — Other Ambulatory Visit: Payer: Self-pay | Admitting: Obstetrics and Gynecology

## 2018-06-24 ENCOUNTER — Encounter: Payer: Self-pay | Admitting: Obstetrics and Gynecology

## 2018-06-24 DIAGNOSIS — K219 Gastro-esophageal reflux disease without esophagitis: Secondary | ICD-10-CM

## 2018-06-24 MED ORDER — FAMOTIDINE 20 MG PO TABS: 20.0000 mg | ORAL_TABLET | Freq: Two times a day (BID) | ORAL | 1 refills | Status: DC

## 2018-06-24 NOTE — Progress Notes (Signed)
Rx change to pepcid for GERD. Getting off omeprazole due to long-term use. F/u prn.

## 2018-07-17 ENCOUNTER — Other Ambulatory Visit: Payer: Self-pay | Admitting: Obstetrics and Gynecology

## 2018-07-17 DIAGNOSIS — K219 Gastro-esophageal reflux disease without esophagitis: Secondary | ICD-10-CM

## 2018-07-17 NOTE — Telephone Encounter (Signed)
Can you please take care of this since [facility] won't be back until 07/23/18.

## 2018-07-23 ENCOUNTER — Other Ambulatory Visit: Payer: Self-pay | Admitting: Obstetrics and Gynecology

## 2018-07-23 ENCOUNTER — Encounter: Payer: Self-pay | Admitting: Obstetrics and Gynecology

## 2018-07-23 DIAGNOSIS — K219 Gastro-esophageal reflux disease without esophagitis: Secondary | ICD-10-CM

## 2018-07-23 NOTE — Progress Notes (Signed)
Refer to GI for GERD. On omeprazole for many yrs with sx relief. Tried to change to pepcid 20 mg BID due to long term use but not controlling sx. Refer to GI for further eval/med mgmt.

## 2018-07-27 ENCOUNTER — Other Ambulatory Visit: Payer: Self-pay | Admitting: Obstetrics and Gynecology

## 2018-07-27 DIAGNOSIS — K219 Gastro-esophageal reflux disease without esophagitis: Secondary | ICD-10-CM

## 2018-08-10 ENCOUNTER — Ambulatory Visit: Payer: BLUE CROSS/BLUE SHIELD | Admitting: Gastroenterology

## 2018-08-12 ENCOUNTER — Ambulatory Visit: Payer: BLUE CROSS/BLUE SHIELD | Admitting: Gastroenterology

## 2018-11-06 ENCOUNTER — Other Ambulatory Visit: Payer: Self-pay | Admitting: Obstetrics and Gynecology

## 2018-11-06 ENCOUNTER — Encounter: Payer: Self-pay | Admitting: Obstetrics and Gynecology

## 2018-11-06 MED ORDER — HYDROXYZINE HCL 25 MG PO TABS: 25.0000 mg | ORAL_TABLET | Freq: Four times a day (QID) | ORAL | 1 refills | Status: DC | PRN

## 2018-11-06 NOTE — Progress Notes (Signed)
Try hydroxyzine with sertraline for increased anxiety with covid.

## 2018-12-23 ENCOUNTER — Encounter: Payer: Self-pay | Admitting: Obstetrics and Gynecology

## 2018-12-24 ENCOUNTER — Other Ambulatory Visit: Payer: Self-pay | Admitting: Obstetrics and Gynecology

## 2018-12-24 DIAGNOSIS — A6000 Herpesviral infection of urogenital system, unspecified: Secondary | ICD-10-CM

## 2018-12-24 MED ORDER — VALACYCLOVIR HCL 500 MG PO TABS: 500.0000 mg | ORAL_TABLET | Freq: Two times a day (BID) | ORAL | 1 refills | Status: DC

## 2018-12-24 NOTE — Progress Notes (Signed)
Rx RF valtrex prn HSV outbreaks.

## 2018-12-25 ENCOUNTER — Encounter: Payer: Self-pay | Admitting: Obstetrics and Gynecology

## 2019-01-01 ENCOUNTER — Other Ambulatory Visit: Payer: Self-pay | Admitting: Obstetrics and Gynecology

## 2019-01-01 DIAGNOSIS — A6000 Herpesviral infection of urogenital system, unspecified: Secondary | ICD-10-CM

## 2019-01-25 ENCOUNTER — Encounter: Payer: Self-pay | Admitting: Obstetrics and Gynecology

## 2019-02-08 ENCOUNTER — Other Ambulatory Visit: Payer: Self-pay | Admitting: Obstetrics and Gynecology

## 2019-02-08 DIAGNOSIS — I10 Essential (primary) hypertension: Secondary | ICD-10-CM

## 2019-02-15 ENCOUNTER — Encounter: Payer: Self-pay | Admitting: Obstetrics and Gynecology

## 2019-02-22 ENCOUNTER — Encounter: Payer: Self-pay | Admitting: Obstetrics and Gynecology

## 2019-02-22 ENCOUNTER — Telehealth: Payer: Self-pay | Admitting: Obstetrics and Gynecology

## 2019-02-22 NOTE — Telephone Encounter (Signed)
Disregard patient previous message, she is scheduled 9/15 for her annual.

## 2019-02-23 ENCOUNTER — Other Ambulatory Visit: Payer: Self-pay | Admitting: Obstetrics and Gynecology

## 2019-02-23 DIAGNOSIS — I10 Essential (primary) hypertension: Secondary | ICD-10-CM

## 2019-02-23 MED ORDER — LISINOPRIL 10 MG PO TABS: 10.0000 mg | ORAL_TABLET | Freq: Every day | ORAL | 0 refills | Status: DC

## 2019-02-23 NOTE — Progress Notes (Signed)
Rx RF lisinopril until 8/20 annual.

## 2019-03-16 ENCOUNTER — Other Ambulatory Visit: Payer: Self-pay | Admitting: Obstetrics and Gynecology

## 2019-03-16 DIAGNOSIS — F329 Major depressive disorder, single episode, unspecified: Secondary | ICD-10-CM

## 2019-03-16 DIAGNOSIS — F32A Depression, unspecified: Secondary | ICD-10-CM

## 2019-03-16 NOTE — Telephone Encounter (Signed)
Please advise 

## 2019-03-19 ENCOUNTER — Other Ambulatory Visit: Payer: Self-pay | Admitting: Obstetrics and Gynecology

## 2019-03-19 DIAGNOSIS — K219 Gastro-esophageal reflux disease without esophagitis: Secondary | ICD-10-CM

## 2019-04-06 ENCOUNTER — Other Ambulatory Visit: Payer: Self-pay

## 2019-04-06 ENCOUNTER — Ambulatory Visit (INDEPENDENT_AMBULATORY_CARE_PROVIDER_SITE_OTHER): Payer: BLUE CROSS/BLUE SHIELD | Admitting: Obstetrics and Gynecology

## 2019-04-06 ENCOUNTER — Encounter: Payer: Self-pay | Admitting: Obstetrics and Gynecology

## 2019-04-06 VITALS — BP 120/80 | Ht 66.0 in | Wt 181.0 lb

## 2019-04-06 DIAGNOSIS — Z01419 Encounter for gynecological examination (general) (routine) without abnormal findings: Secondary | ICD-10-CM

## 2019-04-06 DIAGNOSIS — I10 Essential (primary) hypertension: Secondary | ICD-10-CM

## 2019-04-06 DIAGNOSIS — A6000 Herpesviral infection of urogenital system, unspecified: Secondary | ICD-10-CM

## 2019-04-06 DIAGNOSIS — K219 Gastro-esophageal reflux disease without esophagitis: Secondary | ICD-10-CM

## 2019-04-06 DIAGNOSIS — Z1239 Encounter for other screening for malignant neoplasm of breast: Secondary | ICD-10-CM

## 2019-04-06 DIAGNOSIS — F633 Trichotillomania: Secondary | ICD-10-CM | POA: Insufficient documentation

## 2019-04-06 DIAGNOSIS — F419 Anxiety disorder, unspecified: Secondary | ICD-10-CM

## 2019-04-06 DIAGNOSIS — G43909 Migraine, unspecified, not intractable, without status migrainosus: Secondary | ICD-10-CM | POA: Insufficient documentation

## 2019-04-06 MED ORDER — LISINOPRIL 10 MG PO TABS: 10.0000 mg | ORAL_TABLET | Freq: Every day | ORAL | 3 refills | Status: DC

## 2019-04-06 MED ORDER — VALACYCLOVIR HCL 500 MG PO TABS: 500.0000 mg | ORAL_TABLET | Freq: Two times a day (BID) | ORAL | 1 refills | Status: DC

## 2019-04-06 MED ORDER — SERTRALINE HCL 100 MG PO TABS: ORAL_TABLET | ORAL | 0 refills | Status: DC

## 2019-04-06 MED ORDER — FAMOTIDINE 20 MG PO TABS: 20.0000 mg | ORAL_TABLET | Freq: Two times a day (BID) | ORAL | 3 refills | Status: DC

## 2019-04-06 NOTE — Patient Instructions (Signed)
I value your feedback and entrusting us with your care. If you get a [facility] patient survey, I would appreciate you taking the time to let us know about your experience today. Thank you!  [facility]: [phone]    

## 2019-04-06 NOTE — Progress Notes (Signed)
Chief Complaint  Patient presents with  . Gynecologic Exam     HPI:      [name] is a 43 y.o. [id number] who LMP was Patient's last menstrual period was 03/27/2019 (exact date)., presents today for her annual examination.  Her menses are regular every 28-30 days, lasting 5 days.  Dysmenorrhea mild, occurring first 1-2 days of flow. She does not have intermenstrual bleeding.  Sex activity: not sex active: contraception - tubal ligation.  Last Pap: 11/22/16  Results were: neg cells, neg HPV DNA.  Hx of STDs: HSV. Had sx a few months ago, but infrequent. She needs a RF on valtrex prn.   She had a hx of recurrent BV in the past. Sx occur when sex active, but no sx if not active. No recent sx.  Last mammogram: 05/25/18 Results: no abnormalities, repeat in 12 months. There is no FH of breast cancer. There is no FH of ovarian cancer. The patient does not do self-breast exams.  Tobacco use: The patient denies current or previous tobacco use. Alcohol use: none No drug use. Exercise: moderately active  She does get adequate calcium but not Vitamin D in her diet.  She has GERD sx. She changed to pepcid last yr due to long term use of omeprazole 20 mg daily but it wasn't helpful so went back to omeprazole 40 mg dose. Pt has lost about 25-30# with diet change so thinks pepcid may work now. Willing to try again.  She takes sertraline 150 mg daily for anxiety/OCD tendencies. Increased last yr from 100 mg dose but still pulling hair out on her head due to stress and anxiety. OCD tendancies not bad. Top of head is almost bald.  Diagnosed with HTN last yr at annual, startd on lisinopril 10 mg. Doing well. No side effects. Has lost wt as well.    Active Ambulatory Problems    Diagnosis Date Noted  . BV (bacterial vaginosis) 10/16/2016  . Anxiety and depression   . DUB (dysfunctional uterine bleeding) 01/06/2017  . Anxiety   . Herpes genitalis   . Hypertension 03/2018  . Migraine   .  Trichotillomania    Resolved Ambulatory Problems    Diagnosis Date Noted  . Herpes genitalis 09/30/2016  . Elevated blood pressure reading in office without diagnosis of hypertension 03/02/2018   Past Medical History:  Diagnosis Date  . Anemia   . First degree hemorrhoids      Past Medical History:  Diagnosis Date  . Anemia   . Anxiety   . BV (bacterial vaginosis)   . First degree hemorrhoids   . Herpes genitalis   . Hypertension 03/2018  . Migraine   . Trichotillomania     Past Surgical History:  Procedure Laterality Date  . CESAREAN SECTION    . LEEP    . TUBAL LIGATION      Family History  Problem Relation Age of Onset  . Diabetes Mother   . Gout Mother   . Hypertension Mother   . Hypertension Father   . Bone cancer Maternal Grandmother   . Breast cancer Maternal Grandmother        mid 20's  . Diabetes Paternal Grandfather   . Heart disease Paternal Grandfather     Social History   Socioeconomic History  . Marital status: Legally Separated    Spouse name: Not on file  . Number of children: Not on file  . Years of education: Not on file  .  Highest education level: Not on file  Occupational History  . Not on file  Social Needs  . Financial resource strain: Not on file  . Food insecurity    Worry: Not on file    Inability: Not on file  . Transportation needs    Medical: Not on file    Non-medical: Not on file  Tobacco Use  . Smoking status: Never Smoker  . Smokeless tobacco: Never Used  Substance and Sexual Activity  . Alcohol use: No  . Drug use: No  . Sexual activity: Not Currently    Birth control/protection: Surgical    Comment: BTL  Lifestyle  . Physical activity    Days per week: Not on file    Minutes per session: Not on file  . Stress: Not on file  Relationships  . Social Musicianconnections    Talks on phone: Not on file    Gets together: Not on file    Attends religious service: Not on file    Active member of club or organization:  Not on file    Attends meetings of clubs or organizations: Not on file    Relationship status: Not on file  . Intimate partner violence    Fear of current or ex partner: Not on file    Emotionally abused: Not on file    Physically abused: Not on file    Forced sexual activity: Not on file  Other Topics Concern  . Not on file  Social History Narrative  . Not on file     Current Outpatient Medications:  .  lisinopril (PRINIVIL) 10 MG tablet, Take 1 tablet (10 mg total) by mouth daily., Disp: 90 tablet, Rfl: 3 .  sertraline (ZOLOFT) 100 MG tablet, TAKE 1 AND 1/2 TABS DAILY, Disp: 135 tablet, Rfl: 0 .  valACYclovir (VALTREX) 500 MG tablet, Take 1 tablet (500 mg total) by mouth 2 (two) times daily. For 3-5 days prn outbreak, Disp: 30 tablet, Rfl: 1 .  famotidine (PEPCID) 20 MG tablet, Take 1 tablet (20 mg total) by mouth 2 (two) times daily., Disp: 180 tablet, Rfl: 3  ROS:  Review of Systems  Constitutional: Negative for fever, malaise/fatigue and weight loss.  HENT: Negative for congestion, ear pain and sinus pain.   Respiratory: Negative for cough, shortness of breath and wheezing.   Cardiovascular: Negative for chest pain, orthopnea and leg swelling.  Gastrointestinal: Negative for constipation, diarrhea, nausea and vomiting.  Genitourinary: Negative for dysuria, frequency, hematuria and urgency.       Breast ROS: negative   Musculoskeletal: Negative for back pain, joint pain and myalgias.  Skin: Negative for itching and rash.  Neurological: Negative for dizziness, tingling, focal weakness and headaches.  Endo/Heme/Allergies: Negative for environmental allergies. Does not bruise/bleed easily.  Psychiatric/Behavioral: Negative for depression and suicidal ideas. The patient is nervous/anxious. The patient does not have insomnia.     Objective: BP 120/80   Ht 5\' 6"  (1.676 m)   Wt 181 lb (82.1 kg)   LMP 03/27/2019 (Exact Date)   BMI 29.21 kg/m    Physical  Exam Constitutional:      Appearance: She is well-developed.  Genitourinary:     Vulva, vagina, uterus, right adnexa and left adnexa normal.     No vulval lesion or tenderness noted.     No vaginal discharge, erythema or tenderness.     No cervical motion tenderness or polyp.     Uterus is not enlarged or tender.  No right or left adnexal mass present.     Right adnexa not tender.     Left adnexa not tender.  Neck:     Musculoskeletal: Normal range of motion.     Thyroid: No thyromegaly.  Cardiovascular:     Rate and Rhythm: Normal rate and regular rhythm.     Heart sounds: Normal heart sounds. No murmur.  Pulmonary:     Effort: Pulmonary effort is normal.     Breath sounds: Normal breath sounds.  Chest:     Breasts:        Right: No mass, nipple discharge, skin change or tenderness.        Left: No mass, nipple discharge, skin change or tenderness.  Abdominal:     Palpations: Abdomen is soft.     Tenderness: There is no abdominal tenderness. There is no guarding.  Musculoskeletal: Normal range of motion.  Neurological:     General: No focal deficit present.     Mental Status: She is alert and oriented to person, place, and time.     Cranial Nerves: No cranial nerve deficit.  Skin:    General: Skin is warm and dry.  Psychiatric:        Mood and Affect: Mood normal.        Behavior: Behavior normal.        Thought Content: Thought content normal.        Judgment: Judgment normal.  Vitals signs reviewed.     Assessment/Plan: Encounter for annual routine gynecological examination  Screening for breast cancer - Plan: MM 3D SCREEN BREAST BILATERAL--pt to sched mammo  Genital herpes simplex, unspecified site - Plan: valACYclovir (VALTREX) 500 MG tablet; Rx RF valtrex prn sx  Essential hypertension - Plan: lisinopril (PRINIVIL) 10 MG tablet; doing well. Rx RF lisinopril. F/u prn.   Anxiety  - Plan: sertraline (ZOLOFT) 100 MG tablet; On 150 mg dose, still with  anxiety/trichotillomania. Refer to [facility] pscyh for further mgmt/tx.   Trichotillomania--refer to psych for [employer].  Gastroesophageal reflux disease, esophagitis presence not specified - Plan: famotidine (PEPCID) 20 MG tablet; D/C omeprazole, retry pepcid BID. Has lost wt and may have better sx relief with pepcid.  Meds ordered this encounter  Medications  . lisinopril (PRINIVIL) 10 MG tablet    Sig: Take 1 tablet (10 mg total) by mouth daily.    Dispense:  90 tablet    Refill:  3    Order Specific Question:   Supervising Provider    Answer:   Nadara Mustard [id number]  . famotidine (PEPCID) 20 MG tablet    Sig: Take 1 tablet (20 mg total) by mouth 2 (two) times daily.    Dispense:  180 tablet    Refill:  3    Order Specific Question:   Supervising Provider    Answer:   Nadara Mustard [id number]  . sertraline (ZOLOFT) 100 MG tablet    Sig: TAKE 1 AND 1/2 TABS DAILY    Dispense:  135 tablet    Refill:  0    Order Specific Question:   Supervising Provider    Answer:   Nadara Mustard [id number]  . valACYclovir (VALTREX) 500 MG tablet    Sig: Take 1 tablet (500 mg total) by mouth 2 (two) times daily. For 3-5 days prn outbreak    Dispense:  30 tablet    Refill:  1    Order Specific Question:   Supervising Provider    Answer:  Nadara MustardHARRIS, ROBERT P [[id number]]     GYN counsel mammography screening, adequate intake of calcium and vitamin D, diet and exercise     F/U  Return in about 1 year (around 04/05/2020).  Alicia B. Copland, PA-C 04/06/2019 2:46 PM

## 2019-05-03 ENCOUNTER — Other Ambulatory Visit: Payer: Self-pay | Admitting: Obstetrics and Gynecology

## 2019-05-03 ENCOUNTER — Encounter: Payer: Self-pay | Admitting: Obstetrics and Gynecology

## 2019-05-03 DIAGNOSIS — K219 Gastro-esophageal reflux disease without esophagitis: Secondary | ICD-10-CM

## 2019-05-03 MED ORDER — OMEPRAZOLE 20 MG PO CPDR: DELAYED_RELEASE_CAPSULE | ORAL | 3 refills | Status: DC

## 2019-05-03 NOTE — Progress Notes (Signed)
Rx RF omeprazole 20 mg daily. Sx persisted with pepcid 20 mg BID use.

## 2019-05-17 ENCOUNTER — Ambulatory Visit: Payer: BLUE CROSS/BLUE SHIELD | Admitting: Obstetrics and Gynecology

## 2019-05-18 ENCOUNTER — Encounter: Payer: Self-pay | Admitting: Obstetrics and Gynecology

## 2019-05-18 ENCOUNTER — Ambulatory Visit (INDEPENDENT_AMBULATORY_CARE_PROVIDER_SITE_OTHER): Payer: BLUE CROSS/BLUE SHIELD | Admitting: Obstetrics and Gynecology

## 2019-05-18 ENCOUNTER — Other Ambulatory Visit: Payer: Self-pay

## 2019-05-18 VITALS — BP 140/80 | Ht 65.0 in | Wt 182.0 lb

## 2019-05-18 DIAGNOSIS — N3000 Acute cystitis without hematuria: Secondary | ICD-10-CM | POA: Diagnosis not present

## 2019-05-18 LAB — POCT URINALYSIS DIPSTICK
Bilirubin, UA: NEGATIVE
Blood, UA: NEGATIVE
Glucose, UA: NEGATIVE
Ketones, UA: NEGATIVE
Nitrite, UA: NEGATIVE
Protein, UA: NEGATIVE
Spec Grav, UA: 1.015 (ref 1.010–1.025)
pH, UA: 6 (ref 5.0–8.0)

## 2019-05-18 MED ORDER — NITROFURANTOIN MONOHYD MACRO 100 MG PO CAPS: 100.0000 mg | ORAL_CAPSULE | Freq: Two times a day (BID) | ORAL | 0 refills | Status: AC

## 2019-05-18 NOTE — Progress Notes (Signed)
Danielle Andrews, Danielle Evener, PA-C   Chief Complaint  Patient presents with  . Urinary Tract Infection    urinary frequency and burning, back pain, no blood x 3 days    HPI:      [name] is a 43 y.o. [id number] who LMP was Patient's last menstrual period was 05/06/2019 (approximate)., presents today for UTI sx of urinary frequency, urgency, dysuria, LBP for the past 3 days. No hematuria, pelvic pain, no vag sx.  Hx of E.coli on 4/19 C&S.   Patient Active Problem List   Diagnosis Date Noted  . Anxiety   . Herpes genitalis   . Migraine   . Trichotillomania   . Hypertension 03/2018  . DUB (dysfunctional uterine bleeding) 01/06/2017  . Anxiety and depression   . BV (bacterial vaginosis) 10/16/2016    Past Surgical History:  Procedure Laterality Date  . CESAREAN SECTION    . LEEP    . TUBAL LIGATION      Family History  Problem Relation Age of Onset  . Diabetes Mother   . Gout Mother   . Hypertension Mother   . Hypertension Father   . Bone cancer Maternal Grandmother   . Breast cancer Maternal Grandmother        mid 21's  . Diabetes Paternal Grandfather   . Heart disease Paternal Grandfather     Social History   Socioeconomic History  . Marital status: Legally Separated    Spouse name: Not on file  . Number of children: Not on file  . Years of education: Not on file  . Highest education level: Not on file  Occupational History  . Not on file  Social Needs  . Financial resource strain: Not on file  . Food insecurity    Worry: Not on file    Inability: Not on file  . Transportation needs    Medical: Not on file    Non-medical: Not on file  Tobacco Use  . Smoking status: Never Smoker  . Smokeless tobacco: Never Used  Substance and Sexual Activity  . Alcohol use: No  . Drug use: No  . Sexual activity: Yes    Birth control/protection: Surgical    Comment: BTL  Lifestyle  . Physical activity    Days per week: Not on file    Minutes per session:  Not on file  . Stress: Not on file  Relationships  . Social Herbalist on phone: Not on file    Gets together: Not on file    Attends religious service: Not on file    Active member of club or organization: Not on file    Attends meetings of clubs or organizations: Not on file    Relationship status: Not on file  . Intimate partner violence    Fear of current or ex partner: Not on file    Emotionally abused: Not on file    Physically abused: Not on file    Forced sexual activity: Not on file  Other Topics Concern  . Not on file  Social History Narrative  . Not on file    Outpatient Medications Prior to Visit  Medication Sig Dispense Refill  . lisinopril (PRINIVIL) 10 MG tablet Take 1 tablet (10 mg total) by mouth daily. 90 tablet 3  . omeprazole (PRILOSEC) 20 MG capsule TAKE 1 CAPSULE BY MOUTH EVERY DAY 90 capsule 3  . sertraline (ZOLOFT) 100 MG tablet TAKE 1 AND 1/2 TABS DAILY 135  tablet 0  . valACYclovir (VALTREX) 500 MG tablet Take 1 tablet (500 mg total) by mouth 2 (two) times daily. For 3-5 days prn outbreak 30 tablet 1  . famotidine (PEPCID) 20 MG tablet Take 1 tablet (20 mg total) by mouth 2 (two) times daily. 180 tablet 3   No facility-administered medications prior to visit.       ROS:  Review of Systems  Constitutional: Negative for fever.  Gastrointestinal: Negative for blood in stool, constipation, diarrhea, nausea and vomiting.  Genitourinary: Positive for dysuria, frequency and urgency. Negative for dyspareunia, flank pain, hematuria, vaginal bleeding, vaginal discharge and vaginal pain.  Musculoskeletal: Positive for back pain.  Skin: Negative for rash.  BREAST: No symptoms   OBJECTIVE:   Vitals:  BP 140/80   Ht 5\' 5"  (1.651 m)   Wt 182 lb (82.6 kg)   LMP 05/06/2019 (Approximate)   BMI 30.29 kg/m   Physical Exam Vitals signs reviewed.  Constitutional:      Appearance: She is well-developed. She is not ill-appearing or toxic-appearing.   Neck:     Musculoskeletal: Normal range of motion.  Pulmonary:     Effort: Pulmonary effort is normal.  Abdominal:     Tenderness: There is no right CVA tenderness or left CVA tenderness.  Musculoskeletal: Normal range of motion.  Neurological:     General: No focal deficit present.     Mental Status: She is alert and oriented to person, place, and time.     Cranial Nerves: No cranial nerve deficit.  Psychiatric:        Behavior: Behavior normal.        Thought Content: Thought content normal.        Judgment: Judgment normal.     Results: Results for orders placed or performed in visit on 05/18/19 (from the past 24 hour(s))  POCT Urinalysis Dipstick     Status: Abnormal   Collection Time: 05/18/19  2:50 PM  Result Value Ref Range   Color, UA yellow    Clarity, UA clear    Glucose, UA Negative Negative   Bilirubin, UA neg    Ketones, UA neg    Spec Grav, UA 1.015 1.010 - 1.025   Blood, UA neg    pH, UA 6.0 5.0 - 8.0   Protein, UA Negative Negative   Urobilinogen, UA     Nitrite, UA neg    Leukocytes, UA Trace (A) Negative   Appearance     Odor       Assessment/Plan: Acute cystitis without hematuria - Plan: POCT Urinalysis Dipstick, Urine Culture, nitrofurantoin, macrocrystal-monohydrate, (MACROBID) 100 MG capsule; Pos sx, essentially neg UA. Check C&S. Rx macrobid. F/u prn.    Meds ordered this encounter  Medications  . nitrofurantoin, macrocrystal-monohydrate, (MACROBID) 100 MG capsule    Sig: Take 1 capsule (100 mg total) by mouth 2 (two) times daily for 5 days.    Dispense:  10 capsule    Refill:  0    Order Specific Question:   Supervising Provider    Answer:   05/20/19 Nadara Mustard      Return if symptoms worsen or fail to improve.  Danielle Andrews B. Dandra Velardi, PA-C 05/18/2019 2:52 PM

## 2019-05-18 NOTE — Patient Instructions (Signed)
I value your feedback and entrusting us with your care. If you get a [facility] patient survey, I would appreciate you taking the time to let us know about your experience today. Thank you! 

## 2019-05-22 LAB — URINE CULTURE

## 2019-05-25 ENCOUNTER — Other Ambulatory Visit: Payer: Self-pay

## 2019-05-25 ENCOUNTER — Telehealth: Payer: Self-pay

## 2019-05-25 ENCOUNTER — Encounter: Payer: Self-pay | Admitting: Psychiatry

## 2019-05-25 ENCOUNTER — Ambulatory Visit (INDEPENDENT_AMBULATORY_CARE_PROVIDER_SITE_OTHER): Payer: BLUE CROSS/BLUE SHIELD | Admitting: Psychiatry

## 2019-05-25 DIAGNOSIS — F633 Trichotillomania: Secondary | ICD-10-CM

## 2019-05-25 DIAGNOSIS — F411 Generalized anxiety disorder: Secondary | ICD-10-CM | POA: Diagnosis not present

## 2019-05-25 MED ORDER — FLUOXETINE HCL 20 MG PO CAPS: 20.0000 mg | ORAL_CAPSULE | Freq: Every day | ORAL | 1 refills | Status: DC
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
## 2019-05-25 NOTE — Telephone Encounter (Signed)
lab work orders mailed out  

## 2019-06-12 ENCOUNTER — Other Ambulatory Visit: Payer: Self-pay | Admitting: Obstetrics and Gynecology

## 2019-06-12 DIAGNOSIS — F32A Depression, unspecified: Secondary | ICD-10-CM

## 2019-06-12 DIAGNOSIS — F419 Anxiety disorder, unspecified: Secondary | ICD-10-CM

## 2019-06-12 DIAGNOSIS — F329 Major depressive disorder, single episode, unspecified: Secondary | ICD-10-CM

## 2019-06-16 ENCOUNTER — Ambulatory Visit (INDEPENDENT_AMBULATORY_CARE_PROVIDER_SITE_OTHER): Payer: BLUE CROSS/BLUE SHIELD | Admitting: Psychiatry

## 2019-06-16 ENCOUNTER — Other Ambulatory Visit: Payer: Self-pay

## 2019-06-16 ENCOUNTER — Other Ambulatory Visit: Payer: Self-pay | Admitting: Psychiatry

## 2019-06-16 ENCOUNTER — Encounter: Payer: Self-pay | Admitting: Psychiatry

## 2019-06-16 DIAGNOSIS — G4700 Insomnia, unspecified: Secondary | ICD-10-CM | POA: Diagnosis not present

## 2019-06-16 DIAGNOSIS — F633 Trichotillomania: Secondary | ICD-10-CM

## 2019-06-16 DIAGNOSIS — F411 Generalized anxiety disorder: Secondary | ICD-10-CM | POA: Diagnosis not present
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
## 2019-07-12 ENCOUNTER — Encounter: Payer: Self-pay | Admitting: Psychiatry

## 2019-07-12 ENCOUNTER — Ambulatory Visit (INDEPENDENT_AMBULATORY_CARE_PROVIDER_SITE_OTHER): Payer: BLUE CROSS/BLUE SHIELD | Admitting: Psychiatry

## 2019-07-12 ENCOUNTER — Other Ambulatory Visit: Payer: Self-pay

## 2019-07-12 DIAGNOSIS — G4700 Insomnia, unspecified: Secondary | ICD-10-CM | POA: Diagnosis not present

## 2019-07-12 DIAGNOSIS — F633 Trichotillomania: Secondary | ICD-10-CM | POA: Diagnosis not present

## 2019-07-12 DIAGNOSIS — F411 Generalized anxiety disorder: Secondary | ICD-10-CM

## 2019-07-12 NOTE — Progress Notes (Signed)
Virtual Visit via Video Note  I connected with Danielle Andrews on 07/12/19 at  3:40 PM EST by a video enabled telemedicine application and verified that I am speaking with the correct person using two identifiers.   I discussed the limitations of evaluation and management by telemedicine and the availability of in person appointments. The patient expressed understanding and agreed to proceed.    I discussed the assessment and treatment plan with the patient. The patient was provided an opportunity to ask questions and all were answered. The patient agreed with the plan and demonstrated an understanding of the instructions.   The patient was advised to call back or seek an in-person evaluation if the symptoms worsen or if the condition fails to improve as anticipated.   [facility] MD OP Progress Note  07/12/2019 5:14 PM Jasa Dundon  MRN:  [id number]  Chief Complaint:  Chief Complaint    Follow-up     HPI: Danielle Andrews is a 43 year old divorced female, employed, lives in [place], has a history of GAD, trichotillomania, migraine headaches, hypertension, history of DUB, was evaluated by telemedicine today.  Patient today reports her anxiety symptoms as improving.  She is not pulling her hair as she used to before and her hair is growing back which is a relief.  She reports she is compliant on her Prozac.  She denies any side effects from it at this time and her body is getting used to it.  Patient denies any suicidality, homicidality or perceptual disturbances.  She reports sleep is improving.  Patient continues to be in psychotherapy sessions with Ms. Amber Terance Hart which is going well.  Patient denies any other concerns today. Visit Diagnosis:    ICD-10-CM   1. GAD (generalized anxiety disorder)  F41.1   2. Trichotillomania  F63.3   3. Insomnia, unspecified type  G47.00     Past Psychiatric History: I have reviewed past psychiatric history from my progress note on 05/25/2019.   Past trials of Zoloft.  Past Medical History:  Past Medical History:  Diagnosis Date  . Anemia   . Anxiety   . BV (bacterial vaginosis)   . First degree hemorrhoids   . Herpes genitalis   . Hypertension 03/2018  . Migraine   . Trichotillomania     Past Surgical History:  Procedure Laterality Date  . CESAREAN SECTION    . LEEP    . TUBAL LIGATION      Family Psychiatric History: I have reviewed family psychiatric history from my progress note on 05/25/2019.  Family History:  Family History  Problem Relation Age of Onset  . Diabetes Mother   . Gout Mother   . Hypertension Mother   . Hypertension Father   . Anxiety disorder Father   . Bone cancer Maternal Grandmother   . Breast cancer Maternal Grandmother        mid 110's  . Diabetes Paternal Grandfather   . Heart disease Paternal Grandfather   . Drug abuse Brother   . Anxiety disorder Brother   . Depression Brother   . Suicidality Brother   . Anxiety disorder Sister     Social History: I have reviewed social history from my progress note on 05/25/2019. Social History   Socioeconomic History  . Marital status: Legally Separated    Spouse name: Not on file  . Number of children: 2  . Years of education: Not on file  . Highest education level: Associate degree: occupational, Hotel manager, or vocational program  Occupational History  .  Not on file  Tobacco Use  . Smoking status: Never Smoker  . Smokeless tobacco: Never Used  Substance and Sexual Activity  . Alcohol use: Yes    Alcohol/week: 5.0 standard drinks    Types: 5 Shots of liquor per week  . Drug use: No  . Sexual activity: Yes    Birth control/protection: Surgical    Comment: BTL  Other Topics Concern  . Not on file  Social History Narrative  . Not on file   Social Determinants of Health   Financial Resource Strain: Low Risk   . Difficulty of Paying Living Expenses: Not hard at all  Food Insecurity: No Food Insecurity  . Worried About Community education officer in the Last Year: Never true  . Ran Out of Food in the Last Year: Never true  Transportation Needs: No Transportation Needs  . Lack of Transportation (Medical): No  . Lack of Transportation (Non-Medical): No  Physical Activity: Sufficiently Active  . Days of Exercise per Week: 5 days  . Minutes of Exercise per Session: 50 min  Stress: Stress Concern Present  . Feeling of Stress : Very much  Social Connections: Unknown  . Frequency of Communication with Friends and Family: Not on file  . Frequency of Social Gatherings with Friends and Family: Not on file  . Attends Religious Services: Never  . Active Member of Clubs or Organizations: No  . Attends Banker Meetings: Never  . Marital Status: Separated    Allergies:  Allergies  Allergen Reactions  . Wasp Venom Protein Other (See Comments)    Redness/swelling/blister/pain    Metabolic Disorder Labs: No results found for: HGBA1C, MPG Lab Results  Component Value Date   PROLACTIN 17.5 01/13/2017   No results found for: CHOL, TRIG, HDL, CHOLHDL, VLDL, LDLCALC Lab Results  Component Value Date   TSH 0.647 01/13/2017    Therapeutic Level Labs: No results found for: LITHIUM No results found for: VALPROATE No components found for:  CBMZ  Current Medications: Current Outpatient Medications  Medication Sig Dispense Refill  . FLUoxetine (PROZAC) 20 MG capsule TAKE 1 CAPSULE BY MOUTH DAILY WITH BREAKFAST. 90 capsule 1  . lisinopril (PRINIVIL) 10 MG tablet Take 1 tablet (10 mg total) by mouth daily. 90 tablet 3  . omeprazole (PRILOSEC) 20 MG capsule TAKE 1 CAPSULE BY MOUTH EVERY DAY 90 capsule 3  . valACYclovir (VALTREX) 500 MG tablet Take 1 tablet (500 mg total) by mouth 2 (two) times daily. For 3-5 days prn outbreak 30 tablet 1  . Vitamin D, Ergocalciferol, 50 MCG (2000 UT) CAPS Take by mouth.     No current facility-administered medications for this visit.     Musculoskeletal: Strength & Muscle  Tone: UTA Gait & Station: normal Patient leans: N/A  Psychiatric Specialty Exam: Review of Systems  Psychiatric/Behavioral: The patient is nervous/anxious.   All other systems reviewed and are negative.   There were no vitals taken for this visit.There is no height or weight on file to calculate BMI.  General Appearance: Casual  Eye Contact:  Fair  Speech:  Clear and Coherent  Volume:  Normal  Mood:  Anxious improving  Affect:  Congruent  Thought Process:  Goal Directed and Descriptions of Associations: Intact  Orientation:  Full (Time, Place, and Person)  Thought Content: Logical   Suicidal Thoughts:  No  Homicidal Thoughts:  No  Memory:  Immediate;   Fair Recent;   Fair Remote;   Fair  Judgement:  Fair  Insight:  Fair  Psychomotor Activity:  Normal  Concentration:  Concentration: Fair and Attention Span: Fair  Recall:  [employer]is[employer]Fair  Fund of Knowledge: Fair  Language: Fair  Akathisia:  No  Handed:  Right  AIMS (if indicated): Denies tremors, rigidity  Assets:  Communication Skills Desire for Improvement Financial Resources/Insurance Social Support Talents/Skills Transportation  ADL's:  Intact  Cognition: WNL  Sleep:  Fair   Screenings: GAD-7     [facility]  Total GAD-7 Score  14    PHQ2-9     [facility]  PHQ-2 Total Score  0  PHQ-9 Total Score  4       Assessment and Plan: Danielle Andrews is a 43 year old female, employed, lives in [place], separated, has a history of GAD, trichotillomania, hypertension, migraine headaches,DUB, was evaluated by telemedicine today.  Patient is biologically predisposed given her history of trauma.  She also has psychosocial stressors of relationship struggles with ex-husband, being a single mother.  Patient is currently making progress on the current medication regimen.  She will continue to benefit from psychotherapy sessions.  Plan as noted  below.  Plan GAD-improving Prozac 20 mg p.o. daily with breakfast Patient to continue psychotherapy session with therapist Ms. AES Corporationmber Peterson.  Trichotillomania-improving Prozac 20 mg p.o. daily Continue CBT  Insomnia-improving Patient is currently working on sleep hygiene techniques. Patient reports sleep is improved.  TSH labs-pending  Patient to sign a release to obtain medical records and coordinate care with her therapist.  Follow-up in clinic in 6 weeks or sooner if needed.  Danielle 25 at 4:20 PM  I have spent atleast 15 minutes non face to face with patient today. More than 50 % of the time was spent for psychoeducation and supportive psychotherapy and care coordination. This note was generated in part or whole with voice recognition software. Voice recognition is usually quite accurate but there are transcription errors that can and very often do occur. I apologize for any typographical errors that were not detected and corrected.         Jomarie LongsSaramma Mareli Antunes, MD 07/12/2019, 5:14 PM

## 2019-08-03 ENCOUNTER — Telehealth: Payer: Self-pay | Admitting: Obstetrics and Gynecology

## 2019-08-03 MED ORDER — NITROFURANTOIN MONOHYD MACRO 100 MG PO CAPS: 100.0000 mg | ORAL_CAPSULE | Freq: Two times a day (BID) | ORAL | 0 refills | Status: DC

## 2019-08-03 NOTE — Telephone Encounter (Signed)
Called pt and left detailed msg. 

## 2019-08-03 NOTE — Telephone Encounter (Signed)
Pt called with UTI sx. Hx of E coli on C&S 10/20. Rx macrobid eRxd. F/u prn sx.

## 2019-08-16 ENCOUNTER — Encounter: Payer: Self-pay | Admitting: Psychiatry

## 2019-08-16 ENCOUNTER — Ambulatory Visit (INDEPENDENT_AMBULATORY_CARE_PROVIDER_SITE_OTHER): Payer: BLUE CROSS/BLUE SHIELD | Admitting: Psychiatry

## 2019-08-16 ENCOUNTER — Other Ambulatory Visit: Payer: Self-pay

## 2019-08-16 VITALS — Ht 65.3 in | Wt 179.0 lb

## 2019-08-16 DIAGNOSIS — G4700 Insomnia, unspecified: Secondary | ICD-10-CM

## 2019-08-16 DIAGNOSIS — F633 Trichotillomania: Secondary | ICD-10-CM

## 2019-08-16 DIAGNOSIS — F411 Generalized anxiety disorder: Secondary | ICD-10-CM

## 2019-08-16 MED ORDER — FLUOXETINE HCL 20 MG PO TABS: 30.0000 mg | ORAL_TABLET | Freq: Every day | ORAL | 1 refills | Status: DC

## 2019-08-16 NOTE — Progress Notes (Signed)
Virtual Visit via Video Note  I connected with Danielle Andrews on 08/16/19 at  4:20 PM EST by a video enabled telemedicine application and verified that I am speaking with the correct person using two identifiers.   I discussed the limitations of evaluation and management by telemedicine and the availability of in person appointments. The patient expressed understanding and agreed to proceed.     I discussed the assessment and treatment plan with the patient. The patient was provided an opportunity to ask questions and all were answered. The patient agreed with the plan and demonstrated an understanding of the instructions.   The patient was advised to call back or seek an in-person evaluation if the symptoms worsen or if the condition fails to improve as anticipated.   [facility] MD OP Progress Note  08/16/2019 4:30 PM Netasha Wehrli  MRN:  [id number]  Chief Complaint:  Chief Complaint    Follow-up     HPI: Danielle Andrews is a 44 year old divorced female, employed, lives in [place], has a history of GAD, trichotillomania, migraine headaches, hypertension, history of DU B was evaluated by telemedicine today.  Patient today reports she is currently struggling with anxiety symptoms.  There are days when she feels restless, fidgety and on edge.  She reports she does not know what could be contributing to it.  She also reports some recent hair pulling behavior but overall it has improved a lot.  She reports she has been noncompliant with her therapy sessions since she had to cancel an appointment due to family reasons.  She also reports her health insurance changed and she has to find a new therapist.  Discussed with patient that she can be referred to therapist here and she agrees with plan.  Patient denies any suicidality, homicidality or perceptual disturbances.  Patient denies any other concerns today.   Visit Diagnosis:    ICD-10-CM   1. GAD (generalized anxiety disorder)  F41.1  FLUoxetine (PROZAC) 20 MG tablet  2. Trichotillomania  F63.3 FLUoxetine (PROZAC) 20 MG tablet  3. Insomnia, unspecified type  G47.00     Past Psychiatric History: I have reviewed past psychiatric history from my progress note on 05/25/2019.    Past Medical History:  Past Medical History:  Diagnosis Date  . Anemia   . Anxiety   . BV (bacterial vaginosis)   . First degree hemorrhoids   . Herpes genitalis   . Hypertension 03/2018  . Migraine   . Trichotillomania     Past Surgical History:  Procedure Laterality Date  . CESAREAN SECTION    . LEEP    . TUBAL LIGATION      Family Psychiatric History: I have reviewed family psychiatric history from my progress note on 05/25/2019.  Family History:  Family History  Problem Relation Age of Onset  . Diabetes Mother   . Gout Mother   . Hypertension Mother   . Hypertension Father   . Anxiety disorder Father   . Bone cancer Maternal Grandmother   . Breast cancer Maternal Grandmother        mid 14's  . Diabetes Paternal Grandfather   . Heart disease Paternal Grandfather   . Drug abuse Brother   . Anxiety disorder Brother   . Depression Brother   . Suicidality Brother   . Anxiety disorder Sister     Social History: Reviewed social history from my progress note on 05/25/2019. Social History   Socioeconomic History  . Marital status: Legally Separated    Spouse name:  Not on file  . Number of children: 2  . Years of education: Not on file  . Highest education level: Associate degree: occupational, Hotel manager, or vocational program  Occupational History  . Not on file  Tobacco Use  . Smoking status: Never Smoker  . Smokeless tobacco: Never Used  Substance and Sexual Activity  . Alcohol use: Yes    Alcohol/week: 5.0 standard drinks    Types: 5 Shots of liquor per week  . Drug use: No  . Sexual activity: Yes    Birth control/protection: Surgical    Comment: BTL  Other Topics Concern  . Not on file  Social History  Narrative  . Not on file   Social Determinants of Health   Financial Resource Strain: Low Risk   . Difficulty of Paying Living Expenses: Not hard at all  Food Insecurity: No Food Insecurity  . Worried About Charity fundraiser in the Last Year: Never true  . Ran Out of Food in the Last Year: Never true  Transportation Needs: No Transportation Needs  . Lack of Transportation (Medical): No  . Lack of Transportation (Non-Medical): No  Physical Activity: Sufficiently Active  . Days of Exercise per Week: 5 days  . Minutes of Exercise per Session: 50 min  Stress: Stress Concern Present  . Feeling of Stress : Very much  Social Connections: Unknown  . Frequency of Communication with Friends and Family: Not on file  . Frequency of Social Gatherings with Friends and Family: Not on file  . Attends Religious Services: Never  . Active Member of Clubs or Organizations: No  . Attends Archivist Meetings: Never  . Marital Status: Separated    Allergies:  Allergies  Allergen Reactions  . Wasp Venom Protein Other (See Comments)    Redness/swelling/blister/pain    Metabolic Disorder Labs: No results found for: HGBA1C, MPG Lab Results  Component Value Date   PROLACTIN 17.5 01/13/2017   No results found for: CHOL, TRIG, HDL, CHOLHDL, VLDL, LDLCALC Lab Results  Component Value Date   TSH 0.647 01/13/2017    Therapeutic Level Labs: No results found for: LITHIUM No results found for: VALPROATE No components found for:  CBMZ  Current Medications: Current Outpatient Medications  Medication Sig Dispense Refill  . FLUoxetine (PROZAC) 20 MG tablet Take 1.5 tablets (30 mg total) by mouth daily. 45 tablet 1  . lisinopril (PRINIVIL) 10 MG tablet Take 1 tablet (10 mg total) by mouth daily. 90 tablet 3  . omeprazole (PRILOSEC) 20 MG capsule TAKE 1 CAPSULE BY MOUTH EVERY DAY 90 capsule 3  . valACYclovir (VALTREX) 500 MG tablet Take 1 tablet (500 mg total) by mouth 2 (two) times daily.  For 3-5 days prn outbreak 30 tablet 1  . Vitamin D, Ergocalciferol, 50 MCG (2000 UT) CAPS Take by mouth.     No current facility-administered medications for this visit.     Musculoskeletal: Strength & Muscle Tone: UTA Gait & Station: normal Patient leans: N/A  Psychiatric Specialty Exam: Review of Systems  Psychiatric/Behavioral: The patient is nervous/anxious.   All other systems reviewed and are negative.   Height 5' 5.3" (1.659 m), weight 179 lb (81.2 kg).Body mass index is 29.51 kg/m.  General Appearance: Casual  Eye Contact:  Fair  Speech:  Clear and Coherent  Volume:  Normal  Mood:  Anxious  Affect:  Congruent  Thought Process:  Goal Directed and Descriptions of Associations: Intact  Orientation:  Full (Time, Place, and Person)  Thought  Content: Logical   Suicidal Thoughts:  No  Homicidal Thoughts:  No  Memory:  Immediate;   Fair Recent;   Fair Remote;   Fair  Judgement:  Fair  Insight:  Fair  Psychomotor Activity:  Normal  Concentration:  Concentration: Fair and Attention Span: Fair  Recall:  [employer] of Knowledge: Fair  Language: Fair  Akathisia:  No  Handed:  Right  AIMS (if indicated):Denies tremors, rigidity  Assets:  Communication Skills Desire for Improvement Housing Social Support  ADL's:  Intact  Cognition: WNL  Sleep:  Fair   Screenings: GAD-7     [facility] Visit from 03/02/2018 in [facility]  Total GAD-7 Score  14    PHQ2-9     Office Visit from 03/02/2018 in [facility]  PHQ-2 Total Score  0  PHQ-9 Total Score  4       Assessment and Plan: Rashon is a 44 year old female, employed, lives in [place], separated, has a history of GAD, trichotillomania, hypertension, migraine headaches, DOB was evaluated by telemedicine today.  Patient is biologically predisposed given her history of trauma.  She also has psychosocial stressors of relationship struggles with ex-husband, being a single mother.  She is currently  struggling with anxiety symptoms and will benefit from medication readjustment.  Plan as noted below.  Plan GAD-some progress Increase Prozac to 30 mg p.o. daily with breakfast She has been noncompliant with psychotherapy sessions.  Will refer her to therapist here.  I have sent a referral to [facility].  Trichotillomania-improving Prozac 30 mg p.o. daily Continue CBT  Insomnia-improving Patient is currently working on sleep hygiene techniques   TSH labs-pending  Follow-up in clinic in 4 weeks or sooner if needed.  February 22 at 4:40 PM  I have spent atleast 20 minutes non face to face with patient today. More than 50 % of the time was spent for  ordering medications and test ,psychoeducation and supportive psychotherapy and care coordination,as well as documenting clinical information in electronic health record. This note was generated in part or whole with voice recognition software. Voice recognition is usually quite accurate but there are transcription errors that can and very often do occur. I apologize for any typographical errors that were not detected and corrected.        Jomarie Longs, MD 08/16/2019, 4:30 PM

## 2019-09-13 ENCOUNTER — Ambulatory Visit (INDEPENDENT_AMBULATORY_CARE_PROVIDER_SITE_OTHER): Payer: 59 | Admitting: Psychiatry

## 2019-09-13 ENCOUNTER — Other Ambulatory Visit: Payer: Self-pay

## 2019-09-13 ENCOUNTER — Encounter: Payer: Self-pay | Admitting: Psychiatry

## 2019-09-13 DIAGNOSIS — F411 Generalized anxiety disorder: Secondary | ICD-10-CM | POA: Diagnosis not present

## 2019-09-13 DIAGNOSIS — F633 Trichotillomania: Secondary | ICD-10-CM

## 2019-09-13 DIAGNOSIS — G4700 Insomnia, unspecified: Secondary | ICD-10-CM | POA: Diagnosis not present

## 2019-09-13 NOTE — Progress Notes (Signed)
Provider Location : [facility] Patient Location : Home  Virtual Visit via Video Note  I connected with Danielle Andrews on 09/13/19 at  4:40 PM EST by a video enabled telemedicine application and verified that I am speaking with the correct person using two identifiers.   I discussed the limitations of evaluation and management by telemedicine and the availability of in person appointments. The patient expressed understanding and agreed to proceed.   I discussed the assessment and treatment plan with the patient. The patient was provided an opportunity to ask questions and all were answered. The patient agreed with the plan and demonstrated an understanding of the instructions.   The patient was advised to call back or seek an in-person evaluation if the symptoms worsen or if the condition fails to improve as anticipated.  BH MD OP Progress Note  09/13/2019 5:07 PM Danielle Andrews  MRN:  [id number]  Chief Complaint:  Chief Complaint    Follow-up     HPI: Danielle Andrews is a 44 year old divorced female, employed, lives in [place], has a history of GAD, trichotillomania, migraine headaches, hypertension, history of DU B was evaluated by telemedicine today.  Patient today reports she continues to struggle with anxiety symptoms.  She feels restless and on edge often.  She reports she did not increase the Prozac as discussed last visit.  She reports she did not realize she had a prescription waiting for her at the pharmacy.  She continues to take Prozac 20 mg.  She denies any significant hair pulling behavior at this time.  She does have psychosocial stressors, she reports she has some home repairs going on which is currently stressful.  She also is still waiting for a therapy visit.  Provided her information for [facility].  Patient denies suicidality, homicidality or perceptual disturbances. Visit Diagnosis:    ICD-10-CM   1. GAD (generalized anxiety disorder)  F41.1   2. Trichotillomania   F63.3   3. Insomnia, unspecified type  G47.00     Past Psychiatric History: I have reviewed past psychiatric history from my progress note on 05/25/2019.  Past Medical History:  Past Medical History:  Diagnosis Date  . Anemia   . Anxiety   . BV (bacterial vaginosis)   . First degree hemorrhoids   . Herpes genitalis   . Hypertension 03/2018  . Migraine   . Trichotillomania     Past Surgical History:  Procedure Laterality Date  . CESAREAN SECTION    . LEEP    . TUBAL LIGATION      Family Psychiatric History: Reviewed family psychiatric history from my progress note on 05/25/2019.  Family History:  Family History  Problem Relation Age of Onset  . Diabetes Mother   . Gout Mother   . Hypertension Mother   . Hypertension Father   . Anxiety disorder Father   . Bone cancer Maternal Grandmother   . Breast cancer Maternal Grandmother        mid 53's  . Diabetes Paternal Grandfather   . Heart disease Paternal Grandfather   . Drug abuse Brother   . Anxiety disorder Brother   . Depression Brother   . Suicidality Brother   . Anxiety disorder Sister     Social History: Reviewed social history from my progress note on 05/25/2019. Social History   Socioeconomic History  . Marital status: Legally Separated    Spouse name: Not on file  . Number of children: 2  . Years of education: Not on file  . Highest  education level: Associate degree: occupational, Hotel manager, or vocational program  Occupational History  . Not on file  Tobacco Use  . Smoking status: Never Smoker  . Smokeless tobacco: Never Used  Substance and Sexual Activity  . Alcohol use: Yes    Alcohol/week: 5.0 standard drinks    Types: 5 Shots of liquor per week  . Drug use: No  . Sexual activity: Yes    Birth control/protection: Surgical    Comment: BTL  Other Topics Concern  . Not on file  Social History Narrative  . Not on file   Social Determinants of Health   Financial Resource Strain: Low Risk   .  Difficulty of Paying Living Expenses: Not hard at all  Food Insecurity: No Food Insecurity  . Worried About Charity fundraiser in the Last Year: Never true  . Ran Out of Food in the Last Year: Never true  Transportation Needs: No Transportation Needs  . Lack of Transportation (Medical): No  . Lack of Transportation (Non-Medical): No  Physical Activity: Sufficiently Active  . Days of Exercise per Week: 5 days  . Minutes of Exercise per Session: 50 min  Stress: Stress Concern Present  . Feeling of Stress : Very much  Social Connections: Unknown  . Frequency of Communication with Friends and Family: Not on file  . Frequency of Social Gatherings with Friends and Family: Not on file  . Attends Religious Services: Never  . Active Member of Clubs or Organizations: No  . Attends Archivist Meetings: Never  . Marital Status: Separated    Allergies:  Allergies  Allergen Reactions  . Wasp Venom Protein Other (See Comments)    Redness/swelling/blister/pain    Metabolic Disorder Labs: No results found for: HGBA1C, MPG Lab Results  Component Value Date   PROLACTIN 17.5 01/13/2017   No results found for: CHOL, TRIG, HDL, CHOLHDL, VLDL, LDLCALC Lab Results  Component Value Date   TSH 0.647 01/13/2017    Therapeutic Level Labs: No results found for: LITHIUM No results found for: VALPROATE No components found for:  CBMZ  Current Medications: Current Outpatient Medications  Medication Sig Dispense Refill  . amoxicillin (AMOXIL) 500 MG capsule Take 500 mg by mouth every 8 (eight) hours.    Marland Kitchen FLUoxetine (PROZAC) 20 MG tablet Take 1.5 tablets (30 mg total) by mouth daily. 45 tablet 1  . lisinopril (PRINIVIL) 10 MG tablet Take 1 tablet (10 mg total) by mouth daily. 90 tablet 3  . omeprazole (PRILOSEC) 20 MG capsule TAKE 1 CAPSULE BY MOUTH EVERY DAY 90 capsule 3  . valACYclovir (VALTREX) 500 MG tablet Take 1 tablet (500 mg total) by mouth 2 (two) times daily. For 3-5 days prn  outbreak 30 tablet 1  . Vitamin D, Ergocalciferol, 50 MCG (2000 UT) CAPS Take by mouth.     No current facility-administered medications for this visit.     Musculoskeletal: Strength & Muscle Tone: UTA Gait & Station: normal Patient leans: N/A  Psychiatric Specialty Exam: Review of Systems  Psychiatric/Behavioral: The patient is nervous/anxious.   All other systems reviewed and are negative.   There were no vitals taken for this visit.There is no height or weight on file to calculate BMI.  General Appearance: Casual  Eye Contact:  Fair  Speech:  Clear and Coherent  Volume:  Normal  Mood:  Anxious  Affect:  Congruent  Thought Process:  Goal Directed and Descriptions of Associations: Intact  Orientation:  Full (Time, Place, and Person)  Thought Content: Logical   Suicidal Thoughts:  No  Homicidal Thoughts:  No  Memory:  Immediate;   Fair Recent;   Fair Remote;   Fair  Judgement:  Fair  Insight:  Fair  Psychomotor Activity:  Normal  Concentration:  Concentration: Fair and Attention Span: Fair  Recall:  [employer] of Knowledge: Fair  Language: Fair  Akathisia:  No  Handed:  Right  AIMS (if indicated):UTA  Assets:  Communication Skills Desire for Improvement Housing Social Support  ADL's:  Intact  Cognition: WNL  Sleep:  Fair   Screenings: GAD-7     Office Visit from 03/02/2018 in [facility]  Total GAD-7 Score  14    PHQ2-9     Office Visit from 03/02/2018 in [facility]  PHQ-2 Total Score  0  PHQ-9 Total Score  4       Assessment and Plan: Anwita is a 44 year old female, employed, lives in [place], separated, has a history of GAD, trichotillomania, hypertension, migraine headaches, DU B was evaluated by telemedicine today.  She is biologically predisposed given her history of trauma.  She has psychosocial stressors of relationship struggles, being a single mother as well as recent situation at home.  Patient however is noncompliant  with treatment recommendations and will benefit from medication readjustment and psychotherapy referral.  Plan as noted below.  Plan GAD-unstable Increase Prozac to 30 mg p.o. daily with breakfast.  She has been noncompliant with the increased dosage. Patient encouraged to pick up the prescription from pharmacy. Will refer her to therapist-pending-awaiting a call back from [place].  Trichotillomania-improving Prozac as prescribed Referred for CBT  Insomnia-improving She will continue to work on sleep hygiene techniques.  TSH labs-pending  Follow-up in clinic in 3-4 weeks or sooner if needed.  March 22 at 4:40 PM  I have spent atleast 20 minutes non face to face with patient today. More than 50 % of the time was spent for ordering medications and test ,psychoeducation and supportive psychotherapy and care coordination,as well as documenting clinical information in electronic health record. This note was generated in part or whole with voice recognition software. Voice recognition is usually quite accurate but there are transcription errors that can and very often do occur. I apologize for any typographical errors that were not detected and corrected.       Jomarie Longs, MD 09/13/2019, 5:07 PM

## 2019-09-28 ENCOUNTER — Telehealth (HOSPITAL_COMMUNITY): Payer: Self-pay

## 2019-09-28 DIAGNOSIS — F411 Generalized anxiety disorder: Secondary | ICD-10-CM

## 2019-09-28 DIAGNOSIS — F633 Trichotillomania: Secondary | ICD-10-CM

## 2019-09-28 MED ORDER — FLUOXETINE HCL 10 MG PO CAPS: 10.0000 mg | ORAL_CAPSULE | Freq: Every day | ORAL | 1 refills | Status: DC

## 2019-09-28 MED ORDER — FLUOXETINE HCL 20 MG PO CAPS: 20.0000 mg | ORAL_CAPSULE | Freq: Every day | ORAL | 1 refills | Status: DC

## 2019-09-28 NOTE — Telephone Encounter (Signed)
Clifton Custard from [facility] called regarding patient's Fluoxetine 20mg . I saw in the last ov notes that it was increased to 30mg  but that patient was not compliant with the dosage increase. The pharmacy stated that the tablet is not covered but that the insurance will cover the capsule form. If you would like her to take 30mg , the pharmacy stated that they would need a new script sent in for 10mg  and 20mg  to be taken together. If you would like her to stay on 20mg , they still need a new script for 20mg  capsule instead of tablet. Please review and advise. Thank you.

## 2019-09-28 NOTE — Telephone Encounter (Signed)
I have sent Prozac 10 mg and 20 mg capsule to pharmacy as requested.

## 2019-10-05 ENCOUNTER — Other Ambulatory Visit: Payer: Self-pay

## 2019-10-05 ENCOUNTER — Ambulatory Visit (HOSPITAL_COMMUNITY): Payer: 59 | Admitting: Licensed Clinical Social Worker

## 2019-10-08 ENCOUNTER — Other Ambulatory Visit: Payer: Self-pay

## 2019-10-08 ENCOUNTER — Ambulatory Visit (INDEPENDENT_AMBULATORY_CARE_PROVIDER_SITE_OTHER): Payer: 59 | Admitting: Licensed Clinical Social Worker

## 2019-10-08 DIAGNOSIS — F633 Trichotillomania: Secondary | ICD-10-CM | POA: Diagnosis not present

## 2019-10-08 DIAGNOSIS — F32A Depression, unspecified: Secondary | ICD-10-CM

## 2019-10-08 DIAGNOSIS — F411 Generalized anxiety disorder: Secondary | ICD-10-CM | POA: Diagnosis not present

## 2019-10-08 DIAGNOSIS — F419 Anxiety disorder, unspecified: Secondary | ICD-10-CM | POA: Diagnosis not present

## 2019-10-08 DIAGNOSIS — F329 Major depressive disorder, single episode, unspecified: Secondary | ICD-10-CM

## 2019-10-08 NOTE — Telephone Encounter (Signed)
Had notified patient 
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
## 2019-10-21 ENCOUNTER — Other Ambulatory Visit (HOSPITAL_COMMUNITY): Payer: Self-pay | Admitting: Psychiatry

## 2019-10-21 DIAGNOSIS — F411 Generalized anxiety disorder: Secondary | ICD-10-CM

## 2019-10-21 DIAGNOSIS — F633 Trichotillomania: Secondary | ICD-10-CM

## 2019-12-07 IMAGING — MG DIGITAL SCREENING BILATERAL MAMMOGRAM WITH TOMO AND CAD
6 of 10 series · 6 of 30 positions shown · non-contrast
Comparison: Previous exam(s).

CLINICAL DATA: Screening.

EXAM:
DIGITAL SCREENING BILATERAL MAMMOGRAM WITH TOMO AND CAD

[R CC synth-2D]
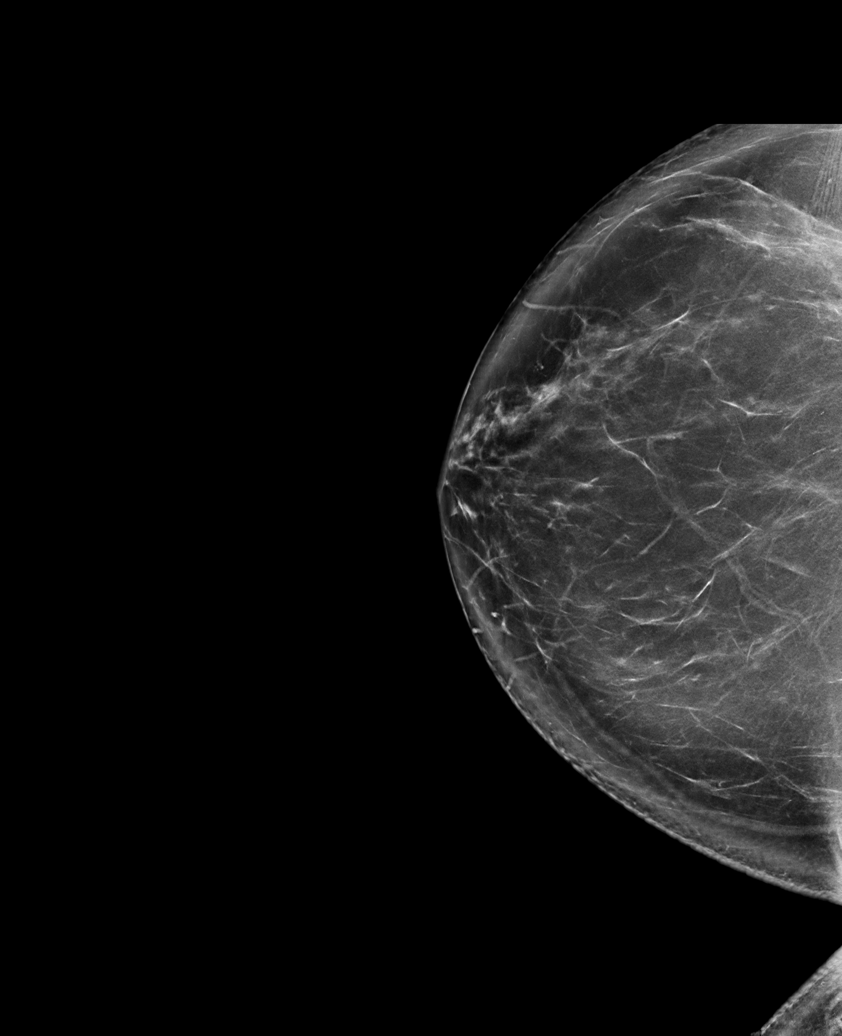

[L CC synth-2D (1 of 2)]
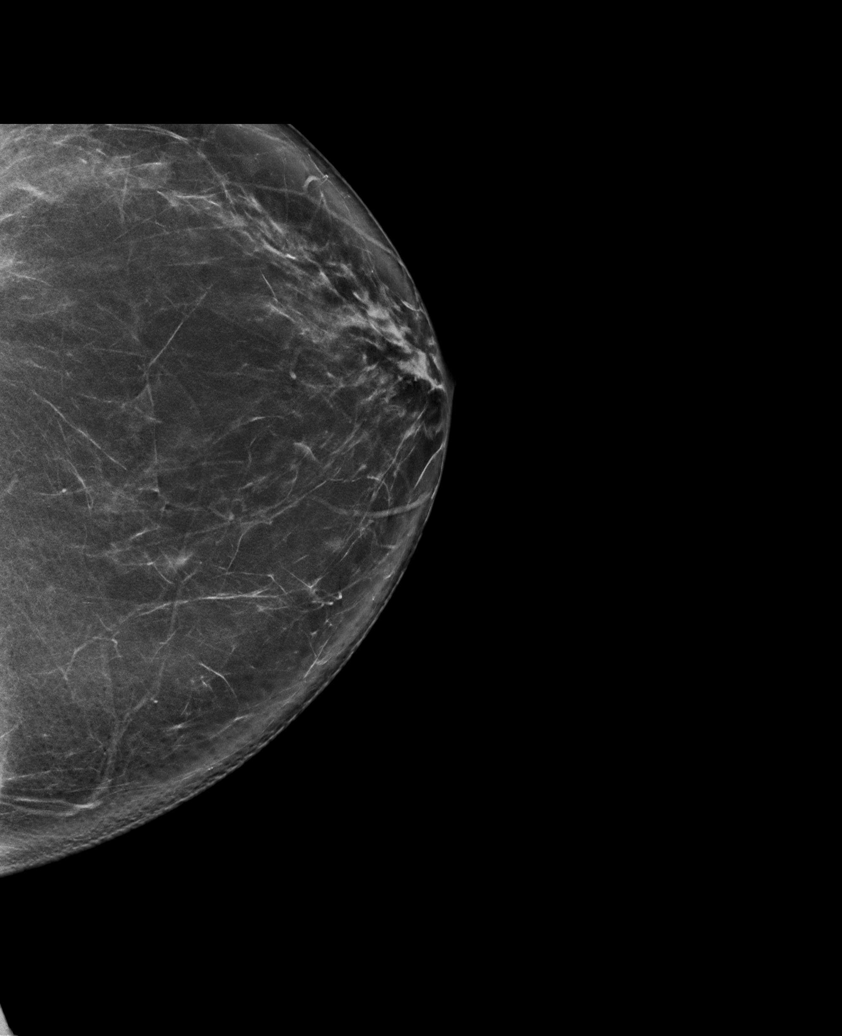

[L MLO synth-2D]
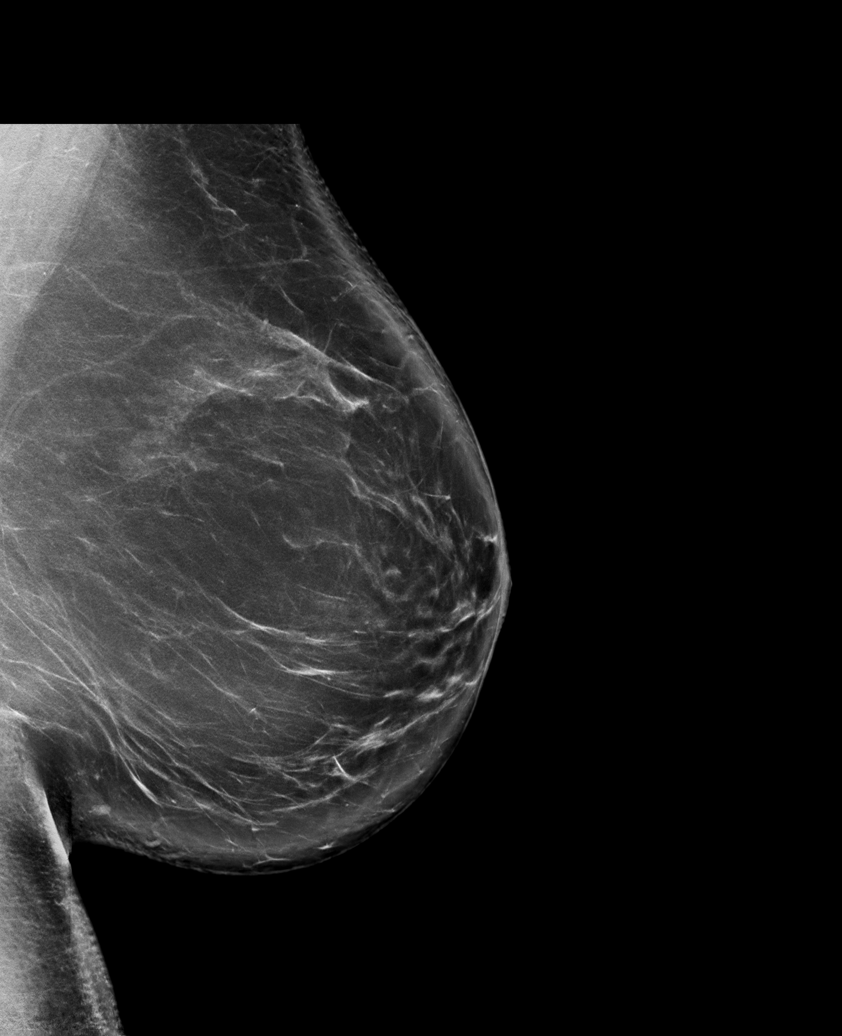

[L CC synth-2D (2 of 2)]
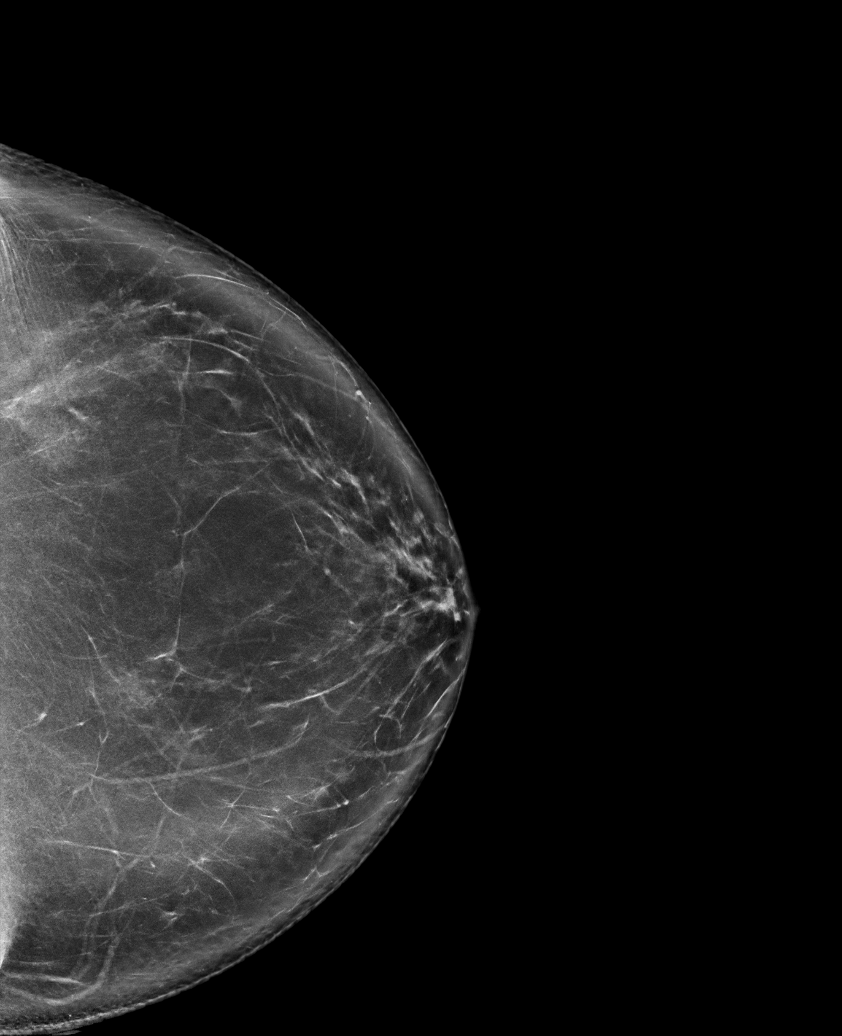

[R MLO synth-2D]
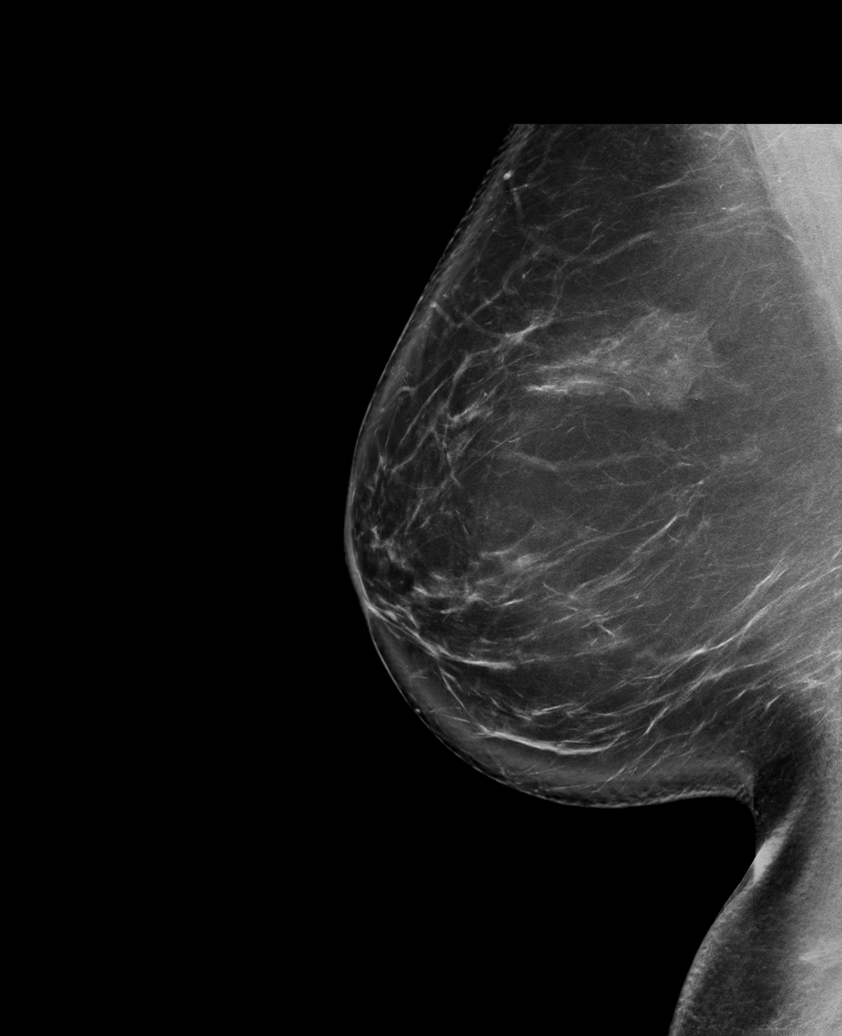

[R MLO tomo · tomo slice 58/115.0]
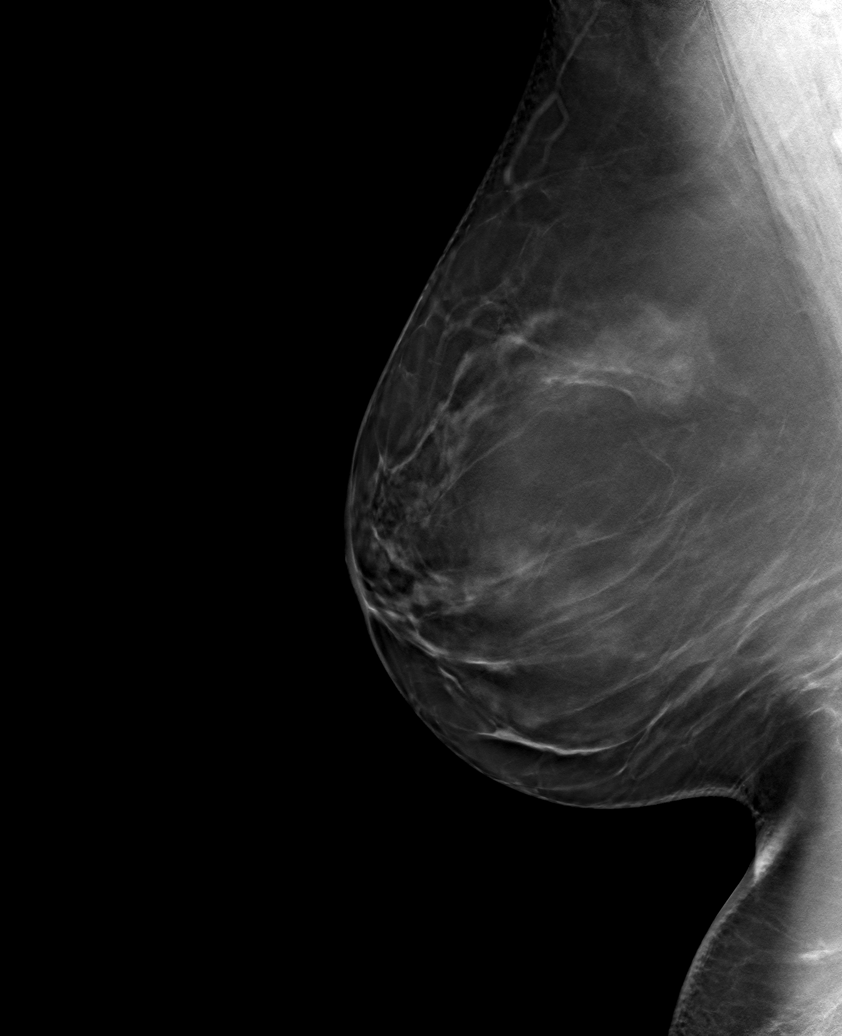

[6 of 30 positions shown; findings below may reference images not displayed]

ACR Breast Density Category b: There are scattered areas of
fibroglandular density.
FINDINGS: There are no findings suspicious for malignancy. Images were
processed with CAD.
IMPRESSION: No mammographic evidence of malignancy. A result letter of this
screening mammogram will be mailed directly to the patient.

RECOMMENDATION:
Screening mammogram in one year. (Code:[id number])

BI-RADS CATEGORY  1: Negative.

## 2020-01-31 ENCOUNTER — Other Ambulatory Visit: Payer: Self-pay | Admitting: Obstetrics and Gynecology

## 2020-01-31 ENCOUNTER — Encounter: Payer: Self-pay | Admitting: Obstetrics and Gynecology

## 2020-01-31 MED ORDER — NITROFURANTOIN MONOHYD MACRO 100 MG PO CAPS: 100.0000 mg | ORAL_CAPSULE | Freq: Two times a day (BID) | ORAL | 0 refills | Status: AC

## 2020-01-31 NOTE — Progress Notes (Signed)
Rx macrobid for UTI sx, pos C&S in past.

## 2020-04-10 ENCOUNTER — Encounter: Payer: Self-pay | Admitting: Obstetrics and Gynecology

## 2020-05-18 ENCOUNTER — Other Ambulatory Visit: Payer: Self-pay | Admitting: Obstetrics and Gynecology

## 2020-05-18 ENCOUNTER — Encounter: Payer: Self-pay | Admitting: Obstetrics and Gynecology

## 2020-05-18 DIAGNOSIS — I10 Essential (primary) hypertension: Secondary | ICD-10-CM

## 2020-05-18 DIAGNOSIS — K219 Gastro-esophageal reflux disease without esophagitis: Secondary | ICD-10-CM

## 2020-06-30 ENCOUNTER — Telehealth: Payer: Self-pay

## 2020-06-30 NOTE — Telephone Encounter (Signed)
Pt calling; is upset b/c she has tried to reach [place] 3 times via MyChart c no response; is completely out of her medications - one of which is a BP medication; has scheduled appt for 07/27/20.  [phone]

## 2020-07-01 ENCOUNTER — Other Ambulatory Visit: Payer: Self-pay | Admitting: Obstetrics and Gynecology

## 2020-07-01 ENCOUNTER — Encounter: Payer: Self-pay | Admitting: Obstetrics and Gynecology

## 2020-07-01 DIAGNOSIS — I10 Essential (primary) hypertension: Secondary | ICD-10-CM

## 2020-07-01 DIAGNOSIS — F633 Trichotillomania: Secondary | ICD-10-CM

## 2020-07-01 DIAGNOSIS — F411 Generalized anxiety disorder: Secondary | ICD-10-CM

## 2020-07-01 DIAGNOSIS — K219 Gastro-esophageal reflux disease without esophagitis: Secondary | ICD-10-CM

## 2020-07-01 MED ORDER — FLUOXETINE HCL 20 MG PO CAPS: 20.0000 mg | ORAL_CAPSULE | Freq: Every day | ORAL | 0 refills | Status: DC

## 2020-07-01 MED ORDER — OMEPRAZOLE 20 MG PO CPDR: DELAYED_RELEASE_CAPSULE | ORAL | 0 refills | Status: DC

## 2020-07-01 MED ORDER — FLUOXETINE HCL 10 MG PO CAPS: 10.0000 mg | ORAL_CAPSULE | Freq: Every day | ORAL | 0 refills | Status: DC

## 2020-07-01 MED ORDER — LISINOPRIL 10 MG PO TABS: 10.0000 mg | ORAL_TABLET | Freq: Every day | ORAL | 0 refills | Status: DC

## 2020-07-01 NOTE — Progress Notes (Signed)
Rx RF meds till annual 1/22 

## 2020-07-01 NOTE — Telephone Encounter (Signed)
Rxs eRxd. Pt messaged in MyChart

## 2020-07-13 ENCOUNTER — Ambulatory Visit: Payer: 59 | Admitting: Obstetrics and Gynecology

## 2020-07-26 NOTE — Progress Notes (Signed)
Chief Complaint  Patient presents with  . Gynecologic Exam     HPI:      [name] is a 45 y.o. G2P2002 who LMP was Patient's last menstrual period was 06/30/2020., presents today for her annual examination.  Her menses are regular every 28-30 days, lasting 5-6 days.  Dysmenorrhea mild, occurring first 1-2 days of flow. She does not have intermenstrual bleeding.  Sex activity: Currently sex active: contraception - tubal ligation.  Last Pap: 11/22/16  Results were: neg cells, neg HPV DNA.  Hx of STDs: HSV. Sx infrequent. She needs a RF on valtrex prn.   Hx of recurrent BV,  No recent sx.  Last mammogram: 05/25/18 Results: no abnormalities, repeat in 12 months. There is no FH of breast cancer. There is no FH of ovarian cancer. The patient does not do self-breast exams.  Tobacco use: The patient denies current or previous tobacco use. Alcohol use: none No drug use. Exercise: moderately active  She does get adequate calcium and Vitamin D in her diet.  She has GERD sx. She tried pepcid due to long term use of omeprazole 20 mg daily but it wasn't helpful so went back to omeprazole 40 mg dose. Pt has lost about 25-30# and now able to do 20 mg daily with sx control. Needs Rx RF  She was taking sertraline 150 mg daily for anxiety/OCD tendencies and trichotillomania. Referred to psych and changed to prozac 30 mg daily. Moderate sx control but then provider stopped seeing pt and refilling meds. Pt called her office several times but never heard back. Stopped prozac about 2-3 months ago and doing ok so far.   Hx of HTN, on lisinopril 10 mg. Doing well. No side effects. Has lost wt as well.   No recent fasting lipids but pt not fasting today.     Active Ambulatory Problems    Diagnosis Date Noted  . BV (bacterial vaginosis) 10/16/2016  . Anxiety and depression   . DUB (dysfunctional uterine bleeding) 01/06/2017  . Anxiety   . Herpes genitalis   . Essential hypertension  03/2018  . Migraine   . Trichotillomania   . GAD (generalized anxiety disorder) 05/25/2019  . Insomnia 06/16/2019  . Gastroesophageal reflux disease without esophagitis 07/27/2020   Resolved Ambulatory Problems    Diagnosis Date Noted  . Herpes genitalis 09/30/2016  . Elevated blood pressure reading in office without diagnosis of hypertension 03/02/2018   Past Medical History:  Diagnosis Date  . Anemia   . First degree hemorrhoids   . Hypertension 03/2018     Past Medical History:  Diagnosis Date  . Anemia   . Anxiety   . BV (bacterial vaginosis)   . First degree hemorrhoids   . Herpes genitalis   . Hypertension 03/2018  . Migraine   . Trichotillomania     Past Surgical History:  Procedure Laterality Date  . CESAREAN SECTION    . LEEP    . TUBAL LIGATION      Family History  Problem Relation Age of Onset  . Diabetes Mother   . Gout Mother   . Hypertension Mother   . Hypertension Father   . Anxiety disorder Father   . Bone cancer Maternal Grandmother   . Breast cancer Maternal Grandmother        mid 45's  . Diabetes Paternal Grandfather   . Heart disease Paternal Grandfather   . Drug abuse Brother   . Anxiety disorder Brother   .  Depression Brother   . Suicidality Brother   . Anxiety disorder Sister     Social History   Socioeconomic History  . Marital status: Legally Separated    Spouse name: Not on file  . Number of children: 2  . Years of education: Not on file  . Highest education level: Associate degree: occupational, Scientist, product/process development, or vocational program  Occupational History  . Not on file  Tobacco Use  . Smoking status: Never Smoker  . Smokeless tobacco: Never Used  Vaping Use  . Vaping Use: Never used  Substance and Sexual Activity  . Alcohol use: Yes    Alcohol/week: 5.0 standard drinks    Types: 5 Shots of liquor per week  . Drug use: No  . Sexual activity: Yes    Birth control/protection: Surgical    Comment: BTL  Other Topics  Concern  . Not on file  Social History Narrative  . Not on file   Social Determinants of Health   Financial Resource Strain: Not on file  Food Insecurity: Not on file  Transportation Needs: Not on file  Physical Activity: Not on file  Stress: Not on file  Social Connections: Not on file  Intimate Partner Violence: Not on file     Current Outpatient Medications:  .  BIOTIN PO, Take by mouth daily., Disp: , Rfl:  .  Probiotic Product (PROBIOTIC PO), Take by mouth daily., Disp: , Rfl:  .  Vitamin D, Ergocalciferol, 50 MCG (2000 UT) CAPS, Take by mouth., Disp: , Rfl:  .  lisinopril (PRINIVIL) 10 MG tablet, Take 1 tablet (10 mg total) by mouth daily., Disp: 90 tablet, Rfl: 3 .  omeprazole (PRILOSEC) 20 MG capsule, TAKE 1 CAPSULE BY MOUTH EVERY DAY, Disp: 90 capsule, Rfl: 3 .  valACYclovir (VALTREX) 500 MG tablet, Take 1 tablet (500 mg total) by mouth 2 (two) times daily. For 3 days prn outbreak, Disp: 30 tablet, Rfl: 1  ROS: Review of Systems  Constitutional: Negative for fatigue, fever and unexpected weight change.  Respiratory: Negative for cough, shortness of breath and wheezing.   Cardiovascular: Negative for chest pain, palpitations and leg swelling.  Gastrointestinal: Negative for blood in stool, constipation, diarrhea, nausea and vomiting.  Endocrine: Negative for cold intolerance, heat intolerance and polyuria.  Genitourinary: Negative for dyspareunia, dysuria, flank pain, frequency, genital sores, hematuria, menstrual problem, pelvic pain, urgency, vaginal bleeding, vaginal discharge and vaginal pain.  Musculoskeletal: Negative for back pain, joint swelling and myalgias.  Skin: Negative for rash.  Neurological: Negative for dizziness, syncope, light-headedness, numbness and headaches.  Hematological: Negative for adenopathy.  Psychiatric/Behavioral: Negative for agitation, confusion, sleep disturbance and suicidal ideas. The patient is not nervous/anxious.      Objective: BP 132/84   Pulse 78   Temp 98.7 F (37.1 C) (Oral)   Ht 5\' 5"  (1.651 m)   Wt 196 lb (88.9 kg)   LMP 06/30/2020   SpO2 95%   BMI 32.62 kg/m    Physical Exam Constitutional:      Appearance: She is well-developed.  Genitourinary:     Vulva normal.     Right Labia: No rash, tenderness or lesions.    Left Labia: No tenderness, lesions or rash.    No vaginal discharge, erythema or tenderness.      Right Adnexa: not tender and no mass present.    Left Adnexa: not tender and no mass present.    No cervical motion tenderness, friability or polyp.     Uterus is  not enlarged or tender.  Breasts:     Right: No mass, nipple discharge, skin change or tenderness.     Left: No mass, nipple discharge, skin change or tenderness.    Neck:     Thyroid: No thyromegaly.  Cardiovascular:     Rate and Rhythm: Normal rate and regular rhythm.     Heart sounds: Normal heart sounds. No murmur heard.   Pulmonary:     Effort: Pulmonary effort is normal.     Breath sounds: Normal breath sounds.  Abdominal:     Palpations: Abdomen is soft.     Tenderness: There is no abdominal tenderness. There is no guarding or rebound.  Musculoskeletal:        General: Normal range of motion.     Cervical back: Normal range of motion.  Lymphadenopathy:     Cervical: No cervical adenopathy.  Neurological:     General: No focal deficit present.     Mental Status: She is alert and oriented to person, place, and time.     Cranial Nerves: No cranial nerve deficit.  Skin:    General: Skin is warm and dry.  Psychiatric:        Mood and Affect: Mood normal.        Behavior: Behavior normal.        Thought Content: Thought content normal.        Judgment: Judgment normal.  Vitals reviewed.     Assessment/Plan: Encounter for annual routine gynecological examination  Encounter for screening mammogram for malignant neoplasm of breast - Plan: MM 3D SCREEN BREAST BILATERAL; pt to sched  mammo  Essential hypertension - Plan: Comprehensive metabolic panel, lisinopril (PRINIVIL) 10 MG tablet; doing well. Rx RF lisinopril. Check CMP.   GAD (generalized anxiety disorder)--was on prozac 30 mg with improvement but not relief. Ok for now, stopped about 2-3 months ago. Will RF prn sx. May need 40 mg dose. F/u prn.  Gastroesophageal reflux disease without esophagitis - Plan: omeprazole (PRILOSEC) 20 MG capsule; Rx RF  Genital herpes simplex, unspecified site - Plan: valACYclovir (VALTREX) 500 MG tablet; Rx RF prn sx. No recent sx.   Perimenopausal vasomotor symptoms - Plan: TSH; check labs. If WNL, reassurance re: sx.   Blood tests for routine general physical examination - Plan: Comprehensive metabolic panel, TSH  Thyroid disorder screening - Plan: TSH    Meds ordered this encounter  Medications  . valACYclovir (VALTREX) 500 MG tablet    Sig: Take 1 tablet (500 mg total) by mouth 2 (two) times daily. For 3 days prn outbreak    Dispense:  30 tablet    Refill:  1    Order Specific Question:   Supervising Provider    Answer:   Nadara Mustard [id number]  . omeprazole (PRILOSEC) 20 MG capsule    Sig: TAKE 1 CAPSULE BY MOUTH EVERY DAY    Dispense:  90 capsule    Refill:  3    Order Specific Question:   Supervising Provider    Answer:   Nadara Mustard [id number]  . lisinopril (PRINIVIL) 10 MG tablet    Sig: Take 1 tablet (10 mg total) by mouth daily.    Dispense:  90 tablet    Refill:  3    Order Specific Question:   Supervising Provider    Answer:   Nadara Mustard [[id number]]     GYN counsel mammography screening, adequate intake of calcium and vitamin D, diet and exercise  F/U  Return in about 1 year (around 07/27/2021).  Shadd Dunstan B. Paolo Okane, PA-C 07/27/2020 9:12 AM

## 2020-07-27 ENCOUNTER — Other Ambulatory Visit: Payer: Self-pay

## 2020-07-27 ENCOUNTER — Encounter: Payer: Self-pay | Admitting: Obstetrics and Gynecology

## 2020-07-27 ENCOUNTER — Ambulatory Visit (INDEPENDENT_AMBULATORY_CARE_PROVIDER_SITE_OTHER): Payer: 59 | Admitting: Obstetrics and Gynecology

## 2020-07-27 VITALS — BP 132/84 | HR 78 | Temp 98.7°F | Ht 65.0 in | Wt 196.0 lb

## 2020-07-27 DIAGNOSIS — Z01419 Encounter for gynecological examination (general) (routine) without abnormal findings: Secondary | ICD-10-CM | POA: Diagnosis not present

## 2020-07-27 DIAGNOSIS — F411 Generalized anxiety disorder: Secondary | ICD-10-CM

## 2020-07-27 DIAGNOSIS — Z1231 Encounter for screening mammogram for malignant neoplasm of breast: Secondary | ICD-10-CM | POA: Diagnosis not present

## 2020-07-27 DIAGNOSIS — Z0000 Encounter for general adult medical examination without abnormal findings: Secondary | ICD-10-CM

## 2020-07-27 DIAGNOSIS — K219 Gastro-esophageal reflux disease without esophagitis: Secondary | ICD-10-CM | POA: Insufficient documentation

## 2020-07-27 DIAGNOSIS — I10 Essential (primary) hypertension: Secondary | ICD-10-CM

## 2020-07-27 DIAGNOSIS — N951 Menopausal and female climacteric states: Secondary | ICD-10-CM

## 2020-07-27 DIAGNOSIS — A6000 Herpesviral infection of urogenital system, unspecified: Secondary | ICD-10-CM

## 2020-07-27 DIAGNOSIS — Z1329 Encounter for screening for other suspected endocrine disorder: Secondary | ICD-10-CM

## 2020-07-27 MED ORDER — LISINOPRIL 10 MG PO TABS: 10.0000 mg | ORAL_TABLET | Freq: Every day | ORAL | 3 refills | Status: AC

## 2020-07-27 MED ORDER — VALACYCLOVIR HCL 500 MG PO TABS: 500.0000 mg | ORAL_TABLET | Freq: Two times a day (BID) | ORAL | 1 refills | Status: AC

## 2020-07-27 MED ORDER — OMEPRAZOLE 20 MG PO CPDR
DELAYED_RELEASE_CAPSULE | ORAL | 3 refills | Status: DC
Start: 2020-07-27 — End: 2022-12-17

## 2020-07-27 NOTE — Patient Instructions (Signed)
I value your feedback and you entrusting us with your care. If you get a [facility] patient survey, I would appreciate you taking the time to let us know about your experience today. Thank you!  [facility]: [phone]      

## 2020-07-28 LAB — TSH: TSH: 0.724 u[IU]/mL (ref 0.450–4.500)

## 2020-07-28 LAB — COMPREHENSIVE METABOLIC PANEL
ALT: 23 IU/L (ref 0–32)
AST: 21 IU/L (ref 0–40)
Albumin/Globulin Ratio: 1.7 (ref 1.2–2.2)
Albumin: 4.7 g/dL (ref 3.8–4.8)
Alkaline Phosphatase: 73 IU/L (ref 44–121)
BUN/Creatinine Ratio: 18 (ref 9–23)
BUN: 14 mg/dL (ref 6–24)
Bilirubin Total: 0.2 mg/dL (ref 0.0–1.2)
CO2: 26 mmol/L (ref 20–29)
Calcium: 9.4 mg/dL (ref 8.7–10.2)
Chloride: 99 mmol/L (ref 96–106)
Creatinine, Ser: 0.8 mg/dL (ref 0.57–1.00)
GFR calc Af Amer: 104 mL/min/{1.73_m2} (ref >59)
GFR calc non Af Amer: 91 mL/min/{1.73_m2} (ref >59)
Globulin, Total: 2.8 g/dL (ref 1.5–4.5)
Glucose: 79 mg/dL (ref 65–99)
Potassium: 3.9 mmol/L (ref 3.5–5.2)
Sodium: 139 mmol/L (ref 134–144)
Total Protein: 7.5 g/dL (ref 6.0–8.5)

## 2020-08-11 ENCOUNTER — Other Ambulatory Visit: Payer: Self-pay | Admitting: Obstetrics and Gynecology

## 2020-08-11 DIAGNOSIS — A6000 Herpesviral infection of urogenital system, unspecified: Secondary | ICD-10-CM

## 2020-08-24 ENCOUNTER — Encounter: Payer: Self-pay | Admitting: Podiatry

## 2020-08-24 ENCOUNTER — Ambulatory Visit (INDEPENDENT_AMBULATORY_CARE_PROVIDER_SITE_OTHER): Payer: 59 | Admitting: Podiatry

## 2020-08-24 ENCOUNTER — Other Ambulatory Visit: Payer: Self-pay

## 2020-08-24 ENCOUNTER — Ambulatory Visit (INDEPENDENT_AMBULATORY_CARE_PROVIDER_SITE_OTHER): Payer: 59

## 2020-08-24 DIAGNOSIS — M722 Plantar fascial fibromatosis: Secondary | ICD-10-CM

## 2020-08-24 NOTE — Progress Notes (Signed)
Subjective:  Patient ID: Danielle Andrews, female    DOB: Aug 18, 1975,  MRN: [id number]  Chief Complaint  Patient presents with  . Foot Pain    Patient presents today for left heel pain x 6 months   She says its the worst in the mornings when getting out of bed and when standing from sitting.  "its sharp and feels like a sore bruise"    45 y.o. female presents with the above complaint. Agree with above in addition there. 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 She does not rest on her foot. She denies seeing anyone else prior to see me. She would like to get this treated.   Review of Systems: Negative except as noted in the HPI. Denies N/V/F/Ch.  Past Medical History:  Diagnosis Date  . Anemia   . Anxiety   . BV (bacterial vaginosis)   . First degree hemorrhoids   . Herpes genitalis   . Hypertension 03/2018  . Migraine   . Trichotillomania     Current Outpatient Medications:  .  BIOTIN PO, Take by mouth daily., Disp: , Rfl:  .  lisinopril (PRINIVIL) 10 MG tablet, Take 1 tablet (10 mg total) by mouth daily., Disp: 90 tablet, Rfl: 3 .  omeprazole (PRILOSEC) 20 MG capsule, TAKE 1 CAPSULE BY MOUTH EVERY DAY, Disp: 90 capsule, Rfl: 3 .  Probiotic Product (PROBIOTIC PO), Take by mouth daily., Disp: , Rfl:  .  valACYclovir (VALTREX) 500 MG tablet, Take 1 tablet (500 mg total) by mouth 2 (two) times daily. For 3 days prn outbreak, Disp: 30 tablet, Rfl: 1 .  Vitamin D, Ergocalciferol, 50 MCG (2000 UT) CAPS, Take by mouth., Disp: , Rfl:   Social History   Tobacco Use  Smoking Status Never Smoker  Smokeless Tobacco Never Used    Allergies  Allergen Reactions  . Wasp Venom Protein Other (See Comments)    Redness/swelling/blister/pain   Objective:  There were no vitals filed for this visit. There is no height or weight on file to calculate BMI. Constitutional Well developed. Well nourished.  Vascular Dorsalis pedis  pulses palpable bilaterally. Posterior tibial pulses palpable bilaterally. Capillary refill normal to all digits.  No cyanosis or clubbing noted. Pedal hair growth normal.  Neurologic Normal speech. Oriented to person, place, and time. Epicritic sensation to light touch grossly present bilaterally.  Dermatologic Nails well groomed and normal in appearance. No open wounds. No skin lesions.  Orthopedic: Normal joint ROM without pain or crepitus bilaterally. No visible deformities. Tender to palpation at the calcaneal tuber left. No pain with calcaneal squeeze left. Ankle ROM diminished range of motion left. Silfverskiold Test: positive left.   Radiographs: Taken and reviewed. No acute fractures or dislocations. No evidence of stress fracture.  Plantar heel spur present. Posterior heel spur absent.   Assessment:   1. Plantar fasciitis of left foot    Plan:  Patient was evaluated and treated and all questions answered.  Plantar Fasciitis, left - XR reviewed as above.  - Educated on icing and stretching. Instructions given.  - Injection delivered to the plantar fascia as below. - DME: Plantar Fascial Brace - Pharmacologic management: None  Procedure: Injection Tendon/Ligament Location: Left plantar fascia at the glabrous junction; medial approach. Skin Prep: alcohol Injectate: 0.5 cc 0.5% marcaine plain, 0.5 cc of 1% Lidocaine, 0.5 cc kenalog 10. Disposition: Patient tolerated procedure well. Injection site dressed with a band-aid.  No follow-ups  on file.

## 2020-08-24 NOTE — Addendum Note (Signed)
Addended by: Geraldine Contras D on: 08/24/2020 09:51 AM   Modules accepted: Orders

## 2020-09-08 ENCOUNTER — Other Ambulatory Visit: Payer: Self-pay

## 2020-09-08 ENCOUNTER — Ambulatory Visit
Admission: RE | Admit: 2020-09-08 | Discharge: 2020-09-08 | Disposition: A | Discharge status: Home / Self Care | Payer: 59 | Source: Ambulatory Visit | Attending: Obstetrics and Gynecology | Admitting: Obstetrics and Gynecology

## 2020-09-08 DIAGNOSIS — Z1231 Encounter for screening mammogram for malignant neoplasm of breast: Secondary | ICD-10-CM | POA: Insufficient documentation

## 2020-09-21 ENCOUNTER — Encounter: Payer: Self-pay | Admitting: Podiatry

## 2020-09-21 ENCOUNTER — Ambulatory Visit (INDEPENDENT_AMBULATORY_CARE_PROVIDER_SITE_OTHER): Payer: 59 | Admitting: Podiatry

## 2020-09-21 ENCOUNTER — Other Ambulatory Visit: Payer: Self-pay

## 2020-09-21 DIAGNOSIS — M7732 Calcaneal spur, left foot: Secondary | ICD-10-CM | POA: Diagnosis not present

## 2020-09-21 DIAGNOSIS — M722 Plantar fascial fibromatosis: Secondary | ICD-10-CM | POA: Diagnosis not present

## 2020-09-21 NOTE — Progress Notes (Signed)
Subjective:  Patient ID: Danielle Andrews, female    DOB: July 07, 1976,  MRN: [id number]  Chief Complaint  Patient presents with  . Plantar Fasciitis    "its been good until yesterday and today its a little sore.  I've been doing more walking since the weather is nice"    45 y.o. female presents with the above complaint.  Patient presents with complaint own and follow-up of left plantar fasciitis.  Patient states she is doing a lot better.  She is about 80% improved.  She would like to discuss her future treatment plans.  She is not has not been very compliant with the stretching but she has been doing everything else.  She has been wearing her brace.  The steroid injection helped a lot.  Review of Systems: Negative except as noted in the HPI. Denies N/V/F/Ch.  Past Medical History:  Diagnosis Date  . Anemia   . Anxiety   . BV (bacterial vaginosis)   . First degree hemorrhoids   . Herpes genitalis   . Hypertension 03/2018  . Migraine   . Trichotillomania     Current Outpatient Medications:  .  BIOTIN PO, Take by mouth daily., Disp: , Rfl:  .  lisinopril (PRINIVIL) 10 MG tablet, Take 1 tablet (10 mg total) by mouth daily., Disp: 90 tablet, Rfl: 3 .  omeprazole (PRILOSEC) 20 MG capsule, TAKE 1 CAPSULE BY MOUTH EVERY DAY, Disp: 90 capsule, Rfl: 3 .  Probiotic Product (PROBIOTIC PO), Take by mouth daily., Disp: , Rfl:  .  valACYclovir (VALTREX) 500 MG tablet, Take 1 tablet (500 mg total) by mouth 2 (two) times daily. For 3 days prn outbreak, Disp: 30 tablet, Rfl: 1 .  Vitamin D, Ergocalciferol, 50 MCG (2000 UT) CAPS, Take by mouth., Disp: , Rfl:   Social History   Tobacco Use  Smoking Status Never Smoker  Smokeless Tobacco Never Used    Allergies  Allergen Reactions  . Wasp Venom Protein Other (See Comments)    Redness/swelling/blister/pain   Objective:  There were no vitals filed for this visit. There is no height or weight on file to calculate BMI. Constitutional Well  developed. Well nourished.  Vascular Dorsalis pedis pulses palpable bilaterally. Posterior tibial pulses palpable bilaterally. Capillary refill normal to all digits.  No cyanosis or clubbing noted. Pedal hair growth normal.  Neurologic Normal speech. Oriented to person, place, and time. Epicritic sensation to light touch grossly present bilaterally.  Dermatologic Nails well groomed and normal in appearance. No open wounds. No skin lesions.  Orthopedic: Normal joint ROM without pain or crepitus bilaterally. No visible deformities. Tender to palpation at the calcaneal tuber left. No pain with calcaneal squeeze left. Ankle ROM diminished range of motion left. Silfverskiold Test: positive left.   Radiographs: Taken and reviewed. No acute fractures or dislocations. No evidence of stress fracture.  Plantar heel spur present. Posterior heel spur absent.   Assessment:   1. Plantar fasciitis of left foot   2. Heel spur, left    Plan:  Patient was evaluated and treated and all questions answered.  Plantar Fasciitis, left - XR reviewed as above.  - Educated on icing and stretching. Instructions given.  -Second injection delivered to the plantar fascia as below. - DME: Night splint - Pharmacologic management: None  -  Procedure: Injection Tendon/Ligament Location: Left plantar fascia at the glabrous junction; medial approach. Skin Prep: alcohol Injectate: 0.5 cc 0.5% marcaine plain, 0.5 cc of 1% Lidocaine, 0.5 cc kenalog 10. Disposition: Patient  tolerated procedure well. Injection site dressed with a band-aid.  No follow-ups on file.

## 2020-10-19 ENCOUNTER — Ambulatory Visit: Payer: 59 | Admitting: Podiatry

## 2020-10-24 ENCOUNTER — Other Ambulatory Visit: Payer: Self-pay

## 2020-10-24 ENCOUNTER — Encounter: Payer: Self-pay | Admitting: Podiatry

## 2020-10-24 ENCOUNTER — Ambulatory Visit (INDEPENDENT_AMBULATORY_CARE_PROVIDER_SITE_OTHER): Payer: 59 | Admitting: Podiatry

## 2020-10-24 DIAGNOSIS — Q666 Other congenital valgus deformities of feet: Secondary | ICD-10-CM

## 2020-10-24 DIAGNOSIS — M722 Plantar fascial fibromatosis: Secondary | ICD-10-CM | POA: Diagnosis not present

## 2020-10-24 NOTE — Progress Notes (Signed)
Subjective:  Patient ID: Danielle Andrews, female    DOB: Nov 29, 1975,  MRN: [id number]  Chief Complaint  Patient presents with  . Plantar Fasciitis    "it was doing better, but started bothering me again last week."    45 y.o. female presents with the above complaint.  Patient presents with complaint own and follow-up of left plantar fasciitis.  Patient states she was doing a lot better.  She was about 9095% improved.  However her pain has returned back again.  She states that she is doing much better than for some she came in however it started bothering her last week.  She denies any other acute complaints.  She has not tried any orthotics/insoles.  She has been wearing her bracing.  Review of Systems: Negative except as noted in the HPI. Denies N/V/F/Ch.  Past Medical History:  Diagnosis Date  . Anemia   . Anxiety   . BV (bacterial vaginosis)   . First degree hemorrhoids   . Herpes genitalis   . Hypertension 03/2018  . Migraine   . Trichotillomania     Current Outpatient Medications:  .  BIOTIN PO, Take by mouth daily., Disp: , Rfl:  .  lisinopril (PRINIVIL) 10 MG tablet, Take 1 tablet (10 mg total) by mouth daily., Disp: 90 tablet, Rfl: 3 .  omeprazole (PRILOSEC) 20 MG capsule, TAKE 1 CAPSULE BY MOUTH EVERY DAY, Disp: 90 capsule, Rfl: 3 .  Probiotic Product (PROBIOTIC PO), Take by mouth daily., Disp: , Rfl:  .  valACYclovir (VALTREX) 500 MG tablet, Take 1 tablet (500 mg total) by mouth 2 (two) times daily. For 3 days prn outbreak, Disp: 30 tablet, Rfl: 1 .  Vitamin D, Ergocalciferol, 50 MCG (2000 UT) CAPS, Take by mouth., Disp: , Rfl:   Social History   Tobacco Use  Smoking Status Never Smoker  Smokeless Tobacco Never Used    Allergies  Allergen Reactions  . Wasp Venom Protein Other (See Comments)    Redness/swelling/blister/pain   Objective:  There were no vitals filed for this visit. There is no height or weight on file to calculate BMI. Constitutional Well  developed. Well nourished.  Vascular Dorsalis pedis pulses palpable bilaterally. Posterior tibial pulses palpable bilaterally. Capillary refill normal to all digits.  No cyanosis or clubbing noted. Pedal hair growth normal.  Neurologic Normal speech. Oriented to person, place, and time. Epicritic sensation to light touch grossly present bilaterally.  Dermatologic Nails well groomed and normal in appearance. No open wounds. No skin lesions.  Orthopedic: Normal joint ROM without pain or crepitus bilaterally. No visible deformities. Tender to palpation at the calcaneal tuber left. No pain with calcaneal squeeze left. Ankle ROM diminished range of motion left. Silfverskiold Test: positive left.   Radiographs: Taken and reviewed. No acute fractures or dislocations. No evidence of stress fracture.  Plantar heel spur present. Posterior heel spur absent.   Assessment:   1. Plantar fasciitis of left foot   2. Pes planovalgus    Plan:  Patient was evaluated and treated and all questions answered.  Plantar Fasciitis, left - XR reviewed as above.  - Educated on icing and stretching. Instructions given.  -Third injection delivered to the plantar fascia as below. - DME: Night splint - Pharmacologic management: None  Semiflexible pes planovalgus -I explained the patient the etiology of pes planovalgus and various treatment options were discussed.  At this time I discussed with her that she will benefit from over-the-counter insoles as there is a financial consideration  for custom insoles.  I discussed with her that she can try and utilize power steps over-the-counter orthotics.  If there is no improvement we will need to discuss custom orthotics.  Procedure: Injection Tendon/Ligament Location: Left plantar fascia at the glabrous junction; medial approach. Skin Prep: alcohol Injectate: 0.5 cc 0.5% marcaine plain, 0.5 cc of 1% Lidocaine, 0.5 cc kenalog 10. Disposition: Patient tolerated  procedure well. Injection site dressed with a band-aid.  No follow-ups on file.

## 2020-10-31 ENCOUNTER — Encounter: Payer: Self-pay | Admitting: Obstetrics and Gynecology

## 2021-04-13 ENCOUNTER — Encounter: Payer: Self-pay | Admitting: Emergency Medicine

## 2021-04-13 ENCOUNTER — Other Ambulatory Visit: Payer: Self-pay

## 2021-04-13 ENCOUNTER — Ambulatory Visit
Admission: EM | Admit: 2021-04-13 | Discharge: 2021-04-13 | Disposition: A | Discharge status: Home / Self Care | Payer: 59 | Attending: Family Medicine | Admitting: Family Medicine

## 2021-04-13 DIAGNOSIS — R0789 Other chest pain: Secondary | ICD-10-CM

## 2021-04-13 NOTE — ED Provider Notes (Signed)
[facility]    CSN: [id number] Arrival date & time: 04/13/21  1826      History   Chief Complaint Chief Complaint  Patient presents with  . Chest Pain    HPI  45 year old female with a history of anxiety, migraine, hypertension presents with chest pain.  Patient states that she has had intermittent central chest pain for greater than 6 months.  She states that it initially would occur 1 time a week and would last a few minutes.  She states that now it is occurring more frequently and seems to be more severe.  She states that today she has had it since this morning.  It has been intermittent.  Pain is currently 2/10 in severity.  She reports that she has numbness and tingling as well as aching in her left arm.  Denies shortness of breath.  No nausea vomiting.  Does note dizziness.  No relieving factors.  No reports of diaphoresis.  No other complaints.  Past Medical History:  Diagnosis Date  . Anemia   . Anxiety   . BV (bacterial vaginosis)   . First degree hemorrhoids   . Herpes genitalis   . Hypertension 03/2018  . Migraine   . Trichotillomania     Patient Active Problem List   Diagnosis Date Noted  . Gastroesophageal reflux disease without esophagitis 07/27/2020  . Insomnia 06/16/2019  . GAD (generalized anxiety disorder) 05/25/2019  . Anxiety   . Herpes genitalis   . Migraine   . Trichotillomania   . Essential hypertension 03/2018  . DUB (dysfunctional uterine bleeding) 01/06/2017  . Anxiety and depression   . BV (bacterial vaginosis) 10/16/2016    Past Surgical History:  Procedure Laterality Date  . CESAREAN SECTION    . LEEP    . TUBAL LIGATION      OB History     Gravida  2   Para  2   Term  2   Preterm      AB      Living  2      SAB      IAB      Ectopic      Multiple      Live Births  2            Home Medications    Prior to Admission medications   Medication Sig Start Date End Date Taking? Authorizing  Provider  BIOTIN PO Take by mouth daily.   Yes [provider]  lisinopril (PRINIVIL) 10 MG tablet Take 1 tablet (10 mg total) by mouth daily. 07/27/20  Yes Copland, Ilona Sorrel, PA-C  omeprazole (PRILOSEC) 20 MG capsule TAKE 1 CAPSULE BY MOUTH EVERY DAY 07/27/20  Yes Copland, Alicia B, PA-C  Probiotic Product (PROBIOTIC PO) Take by mouth daily.   Yes [provider]  Vitamin D, Ergocalciferol, 50 MCG (2000 UT) CAPS Take by mouth.   Yes [provider]  valACYclovir (VALTREX) 500 MG tablet Take 1 tablet (500 mg total) by mouth 2 (two) times daily. For 3 days prn outbreak 07/27/20   Copland, Ilona Sorrel, PA-C    Family History Family History  Problem Relation Age of Onset  . Diabetes Mother   . Gout Mother   . Hypertension Mother   . Hypertension Father   . Anxiety disorder Father   . Bone cancer Maternal Grandmother   . Breast cancer Maternal Grandmother        mid 28's  . Diabetes Paternal  Grandfather   . Heart disease Paternal Grandfather   . Drug abuse Brother   . Anxiety disorder Brother   . Depression Brother   . Suicidality Brother   . Anxiety disorder Sister     Social History Social History   Tobacco Use  . Smoking status: Never  . Smokeless tobacco: Never  Vaping Use  . Vaping Use: Never used  Substance Use Topics  . Alcohol use: Yes    Alcohol/week: 5.0 standard drinks    Types: 5 Shots of liquor per week  . Drug use: No     Allergies   Wasp venom protein   Review of Systems Review of Systems Per HPI  Physical Exam Triage Vital Signs ED Triage Vitals  Enc Vitals Group     BP 04/13/21 1837 (!) 159/104     Pulse Rate 04/13/21 1837 67     Resp 04/13/21 1837 14     Temp 04/13/21 1837 98.7 F (37.1 C)     Temp Source 04/13/21 1837 Oral     SpO2 04/13/21 1837 96 %     Weight 04/13/21 1834 186 lb (84.4 kg)     Height 04/13/21 1834 5' 5.5" (1.664 m)     Head Circumference --      Peak Flow --      Pain Score 04/13/21 1833 2      Pain Loc --      Pain Edu? --      Excl. in GC? --    Updated Vital Signs BP (!) 159/104 (BP Location: Left Arm)   Pulse 67   Temp 98.7 F (37.1 C) (Oral)   Resp 14   Ht 5' 5.5" (1.664 m)   Wt 84.4 kg   SpO2 96%   BMI 30.48 kg/m   Visual Acuity Right Eye Distance:   Left Eye Distance:   Bilateral Distance:    Right Eye Near:   Left Eye Near:    Bilateral Near:     Physical Exam Vitals and nursing note reviewed.  Constitutional:      General: She is not in acute distress.    Appearance: Normal appearance. She is not ill-appearing.  HENT:     Head: Normocephalic and atraumatic.  Eyes:     General:        Right eye: No discharge.        Left eye: No discharge.     Conjunctiva/sclera: Conjunctivae normal.  Cardiovascular:     Rate and Rhythm: Normal rate and regular rhythm.     Heart sounds: No murmur heard. Pulmonary:     Effort: Pulmonary effort is normal.     Breath sounds: Normal breath sounds. No wheezing, rhonchi or rales.  Neurological:     Mental Status: She is alert.  Psychiatric:        Mood and Affect: Mood normal.        Behavior: Behavior normal.     UC Treatments / Results  Labs (all labs ordered are listed, but only abnormal results are displayed) Labs Reviewed - No data to display  EKG Interpretation: Normal sinus rhythm with rate of 69.  Normal axis.  Normal intervals.  No ST-T wave changes.  Normal EKG.  Radiology No results found.  Procedures Procedures (including critical care time)  Medications Ordered in UC Medications - No data to display  Initial Impression / Assessment and Plan / UC Course  I have reviewed the triage vital signs and the nursing notes.  Pertinent labs & imaging results that were available during my care of the patient were reviewed by me and considered in my medical decision making (see chart for details).    45 year old female presents with atypical chest pain.  Patient has had ongoing intermittent chest  pain for the past 6 months.  She states that it has been more frequent and worsening as of late.  Her chest pain is very atypical.  EKG normal today.  I advised her that I suspect that this is related to stress and anxiety.  Advised to follow-up with her primary care physician to discuss possible referral to cardiology.  Final Clinical Impressions(s) / UC Diagnoses   Final diagnoses:  Atypical chest pain     Discharge Instructions      EKG was normal.  Follow up with your PCP and discuss possible referral.  Take care  Dr. Adriana Simas    ED Prescriptions   None    PDMP not reviewed this encounter.   Tommie Sams, [place] 04/13/21 1928

## 2021-04-13 NOTE — ED Triage Notes (Signed)
Patient c/o chest pain off and on that started this morning.  Patient states that she has had chest pain for months.  Patient denies SOB.  Patient reports some numbness and tingling in her left arm.

## 2021-04-13 NOTE — Discharge Instructions (Signed)
EKG was normal.  Follow up with your PCP and discuss possible referral.  Take care  Dr. Adriana Simas

## 2021-07-24 ENCOUNTER — Ambulatory Visit: Payer: 59 | Admitting: Podiatry

## 2021-07-29 ENCOUNTER — Other Ambulatory Visit: Payer: Self-pay | Admitting: Obstetrics and Gynecology

## 2021-07-29 DIAGNOSIS — I10 Essential (primary) hypertension: Secondary | ICD-10-CM

## 2021-07-30 ENCOUNTER — Other Ambulatory Visit: Payer: Self-pay | Admitting: Obstetrics and Gynecology

## 2021-07-30 DIAGNOSIS — K219 Gastro-esophageal reflux disease without esophagitis: Secondary | ICD-10-CM

## 2021-11-06 ENCOUNTER — Ambulatory Visit: Payer: Self-pay | Admitting: Obstetrics and Gynecology

## 2021-11-13 ENCOUNTER — Ambulatory Visit: Payer: Self-pay | Admitting: Obstetrics and Gynecology

## 2022-07-29 ENCOUNTER — Emergency Department
Admission: EM | Admit: 2022-07-29 | Discharge: 2022-07-29 | Disposition: A | Discharge status: Home / Self Care | Payer: 59 | Attending: Emergency Medicine | Admitting: Emergency Medicine

## 2022-07-29 ENCOUNTER — Other Ambulatory Visit: Payer: Self-pay

## 2022-07-29 DIAGNOSIS — K6289 Other specified diseases of anus and rectum: Secondary | ICD-10-CM | POA: Diagnosis not present

## 2022-07-29 DIAGNOSIS — K645 Perianal venous thrombosis: Secondary | ICD-10-CM | POA: Diagnosis not present

## 2022-07-29 DIAGNOSIS — K644 Residual hemorrhoidal skin tags: Secondary | ICD-10-CM | POA: Diagnosis not present

## 2022-07-29 LAB — BASIC METABOLIC PANEL
Anion gap: 8 (ref 5–15)
BUN: 17 mg/dL (ref 6–20)
CO2: 26 mmol/L (ref 22–32)
Calcium: 9.4 mg/dL (ref 8.9–10.3)
Chloride: 104 mmol/L (ref 98–111)
Creatinine, Ser: 0.79 mg/dL (ref 0.44–1.00)
GFR, Estimated: >60 mL/min (ref >60)
Glucose, Bld: 85 mg/dL (ref 70–99)
Potassium: 3.2 mmol/L — ABNORMAL LOW (ref 3.5–5.1)
Sodium: 138 mmol/L (ref 135–145)

## 2022-07-29 LAB — CBC WITH DIFFERENTIAL/PLATELET
Abs Immature Granulocytes: 0.02 10*3/uL (ref 0.00–0.07)
Basophils Absolute: 0 10*3/uL (ref 0.0–0.1)
Basophils Relative: 1 %
Eosinophils Absolute: 0.1 10*3/uL (ref 0.0–0.5)
Eosinophils Relative: 2 %
HCT: 37.3 % (ref 36.0–46.0)
Hemoglobin: 12.6 g/dL (ref 12.0–15.0)
Immature Granulocytes: 0 %
Lymphocytes Relative: 34 %
Lymphs Abs: 1.5 10*3/uL (ref 0.7–4.0)
MCH: 32.6 pg (ref 26.0–34.0)
MCHC: 33.8 g/dL (ref 30.0–36.0)
MCV: 96.4 fL (ref 80.0–100.0)
Monocytes Absolute: 0.4 10*3/uL (ref 0.1–1.0)
Monocytes Relative: 9 %
Neutro Abs: 2.5 10*3/uL (ref 1.7–7.7)
Neutrophils Relative %: 54 %
Platelets: 246 10*3/uL (ref 150–400)
RBC: 3.87 MIL/uL (ref 3.87–5.11)
RDW: 12.5 % (ref 11.5–15.5)
WBC: 4.6 10*3/uL (ref 4.0–10.5)
nRBC: 0 % (ref 0.0–0.2)

## 2022-07-29 MED ORDER — LIDOCAINE-EPINEPHRINE-TETRACAINE (LET) TOPICAL GEL
3.0000 mL | Freq: Once | TOPICAL | Status: AC
  Administered 2022-07-29: 3 mL via TOPICAL
  Filled 2022-07-29: qty 3

## 2022-07-29 MED ORDER — LIDO-EPINEPHRINE-TETRACAINE 4-0.05-0.5 % EX GEL: 1.0000 mL | Freq: Three times a day (TID) | CUTANEOUS | 3 refills | Status: DC

## 2022-07-29 MED ORDER — NITROGLYCERIN 2 % TD OINT: 0.5000 [in_us] | TOPICAL_OINTMENT | Freq: Three times a day (TID) | TRANSDERMAL | 0 refills | Status: DC

## 2022-07-29 NOTE — ED Triage Notes (Signed)
C?O history of hemorrhoids. States current flare is bad.  C/O bleeding hemorrhoids.  Onset of bleeding yesterday.  AAOx3.  Skin warm and dry. NAD

## 2022-07-29 NOTE — ED Provider Notes (Signed)
[facility] Provider Note   Event Date/Time   First MD Initiated Contact with Patient 07/29/22 1828     (approximate) History  No chief complaint on file.  HPI Danielle Andrews is a 47 y.o. female with a stated past medical history of internal and external hemorrhoids who presents for worsening rectal pain in the setting of bright red blood that she states started yesterday and has been stable since onset.  Patient states that she attempted to put a washcloth and continue working however she states that she symptoms washcloth and has been dripping on the floor afterwards.  Patient states that she has never had symptoms this bad in the past and normally can control them with Preparation H and sitz bath.  Patient denies any constipation ROS: Patient currently denies any vision changes, tinnitus, difficulty speaking, facial droop, sore throat, chest pain, shortness of breath, abdominal pain, nausea/vomiting/diarrhea, dysuria, or weakness/numbness/paresthesias in any extremity   Physical Exam  Triage Vital Signs: ED Triage Vitals  Enc Vitals Group     BP 07/29/22 1434 (!) 152/103     Pulse Rate 07/29/22 1434 82     Resp 07/29/22 1434 16     Temp 07/29/22 1434 98.3 F (36.8 C)     Temp Source 07/29/22 1434 Oral     SpO2 07/29/22 1434 98 %     Weight 07/29/22 1433 186 lb 1.1 oz (84.4 kg)     Height 07/29/22 1433 5' 5.5" (1.664 m)     Head Circumference --      Peak Flow --      Pain Score 07/29/22 1433 0     Pain Loc --      Pain Edu? --      Excl. in GC? --    Most recent vital signs: Vitals:   07/29/22 1434  BP: (!) 152/103  Pulse: 82  Resp: 16  Temp: 98.3 F (36.8 C)  SpO2: 98%   General: Awake, oriented x4. CV:  Good peripheral perfusion.  Resp:  Normal effort.  Abd:  No distention.  Rectal:  Bleeding external hemorrhoid with clot in place Other:  Middle-aged overweight Caucasian female laying in bed in no acute distress ED Results / Procedures  / Treatments  Labs (all labs ordered are listed, but only abnormal results are displayed) Labs Reviewed  BASIC METABOLIC PANEL - Abnormal; Notable for the following components:      Result Value   Potassium 3.2 (*)    All other components within normal limits  CBC WITH DIFFERENTIAL/PLATELET  PROCEDURES: Critical Care performed: No Procedures MEDICATIONS ORDERED IN ED: Medications  lidocaine-EPINEPHrine-tetracaine (LET) topical gel (3 mLs Topical Given 07/29/22 2034)   IMPRESSION / MDM / ASSESSMENT AND PLAN / ED COURSE  I reviewed the triage vital signs and the nursing notes.                             The patient is on the cardiac monitor to evaluate for evidence of arrhythmia and/or significant heart rate changes. Patient's presentation is most consistent with acute presentation with potential threat to life or bodily function. Given history and exam patients presentation most consistent with Lower GI bleed secondary to bleeding external hemorrhoid I have low suspicion for Aortoenteric fistula, Upper GI Bleed, IBD, Mesenteric Ischemia, Rectal foreign body or ulcer.  Workup: CBC, BMP Rx: Let, nitroglycerin gel Disposition: Discharge. . SRP and prompt PCP and GI follow  up.   FINAL CLINICAL IMPRESSION(S) / ED DIAGNOSES   Final diagnoses:  Thrombosed external hemorrhoid   Rx / DC Orders   ED Discharge Orders          Ordered    lido-EPINEPHrine-Tetracaine 4-0.05-0.5 % GEL  3 times daily        07/29/22 2145    nitroGLYCERIN (NITROGLYN) 2 % ointment  3 times daily        07/29/22 2145           Note:  This document was prepared using Dragon voice recognition software and may include unintentional dictation errors.   Naaman Plummer, MD 07/29/22 [phone]

## 2022-07-29 NOTE — ED Provider Triage Note (Signed)
Emergency Medicine Provider Triage Evaluation Note  Danielle Andrews , a 47 y.o. female  was evaluated in triage.  Pt complains of bleeding hemorrhoids, soaking through her pants.  Review of Systems  Positive:  Negative:   Physical Exam  BP (!) 152/103 (BP Location: Left Arm)   Pulse 82   Temp 98.3 F (36.8 C) (Oral)   Resp 16   Ht 5' 5.5" (1.664 m)   Wt 84.4 kg   SpO2 98%   BMI 30.49 kg/m  Gen:   Awake, no distress   Resp:  Normal effort  MSK:   Moves extremities without difficulty  Other:    Medical Decision Making  Medically screening exam initiated at 2:35 PM.  Appropriate orders placed.  Danielle Andrews was informed that the remainder of the evaluation will be completed by another provider, this initial triage assessment does not replace that evaluation, and the importance of remaining in the ED until their evaluation is complete.  Rectal bleeding protocols   Danielle Starks, PA-C 07/29/22 1436

## 2022-08-06 ENCOUNTER — Telehealth: Payer: Self-pay

## 2022-08-06 NOTE — Patient Outreach (Signed)
  Care Coordination TOC Note Transition Care Management Follow-up Telephone Call Date of discharge and from where:  07/31/22-[facility] ED   DX: "thrombosed external hemorrhoid" Red on EMMI-ED Discharge Alert Reason: "Scheduled follow-up appt? No" Red Alert Date: 07/31/22 How have you been since you were released from the hospital? Patient reports she is doing okay. She is feeling better. She has some minor discomfort at times. He has a little discharge when she goes to bathroom but much better. Patient states she wants to establish are with specialist to help with chronic condition. RN CM  discussed process. Patient does not currently have a  PCP. Encouraged to call insurance provider to obtain list of in network providers. Also provided patient with [facility] contact [phone]). Patient will follow up with resources to obtain a PCP.  Any questions or concerns? Yes-Patient states he has not gotten one of her meds prescribed from ED as pharmacy had to order med and get it in stock. Advised patient that it has been several days and to follow back up with pharmacy to see if med available. If not, she may have to transfer script to another pharmacy that has med in stock. She voiced understanding.   Items Reviewed: Did the pt receive and understand the discharge instructions provided? Yes  Medications obtained and verified? Yes  Other? No  Any new allergies since your discharge? No  Dietary orders reviewed? Yes Do you have support at home? Yes   Home Care and Equipment/Supplies: Were home health services ordered? not applicable If so, what is the name of the agency? N/A  Has the agency set up a time to come to the patient's home? not applicable Were any new equipment or medical supplies ordered?  No What is the name of the medical supply agency? N/A Were you able to get the supplies/equipment? not applicable Do you have any questions related to the use of the equipment or supplies?  No  Functional Questionnaire: (I = Independent and D = Dependent) ADLs: I  Bathing/Dressing- I  Meal Prep- I  Eating- I  Maintaining continence- I  Transferring/Ambulation- I  Managing Meds- I  Follow up appointments reviewed:  PCP Hospital f/u appt confirmed?   [facility] f/u appt confirmed?  N/A   Are transportation arrangements needed? No  If their condition worsens, is the pt aware to call PCP or go to the Emergency Dept.? Yes Was the patient provided with contact information for the PCP's office or ED? Yes Was to pt encouraged to call back with questions or concerns? Yes  SDOH assessments and interventions completed:   Yes SDOH Interventions Today    Flowsheet Row Most Recent Value  SDOH Interventions   Food Insecurity Interventions Intervention Not Indicated  Transportation Interventions Intervention Not Indicated       Care Coordination Interventions:  Education provided RN CM provided patient with info on obtaining PCP   Encounter Outcome:  Pt. Visit Completed    Enzo Montgomery, RN,BSN,CCM [facility] Telephonic Care Management Coordinator Direct Phone: [phone] Toll Free: [phone] Fax: [phone]

## 2022-08-08 ENCOUNTER — Telehealth: Payer: Self-pay | Admitting: Emergency Medicine

## 2022-08-08 NOTE — Telephone Encounter (Signed)
[facility] called due to they do not have the Lidocaine/epi/tetracaine that was ordered.  Pharmacist states that she has called around to other stores and has been unable to find any other store with it.  Per dr Corky Downs, no other meds--except otc preparation H and follow up with the surgeon.  I called the patient.  She is using the prep h and tucks already.  She has an otc lidocaine.  Says she has to wait until first week of February to see a pcp.  Then get referral to surgeon from pcp.  Per her insurance the referral has to come from pcp.  Informed her that if she needs care in the mean time, she can go to [facility] or [facility].

## 2022-08-27 DIAGNOSIS — Z13220 Encounter for screening for lipoid disorders: Secondary | ICD-10-CM | POA: Diagnosis not present

## 2022-08-27 DIAGNOSIS — R69 Illness, unspecified: Secondary | ICD-10-CM | POA: Diagnosis not present

## 2022-08-27 DIAGNOSIS — K644 Residual hemorrhoidal skin tags: Secondary | ICD-10-CM | POA: Diagnosis not present

## 2022-08-27 DIAGNOSIS — I10 Essential (primary) hypertension: Secondary | ICD-10-CM | POA: Diagnosis not present

## 2022-08-27 DIAGNOSIS — N951 Menopausal and female climacteric states: Secondary | ICD-10-CM | POA: Diagnosis not present

## 2022-08-27 DIAGNOSIS — Z1329 Encounter for screening for other suspected endocrine disorder: Secondary | ICD-10-CM | POA: Diagnosis not present

## 2022-08-27 DIAGNOSIS — Z131 Encounter for screening for diabetes mellitus: Secondary | ICD-10-CM | POA: Diagnosis not present

## 2022-08-27 DIAGNOSIS — K219 Gastro-esophageal reflux disease without esophagitis: Secondary | ICD-10-CM | POA: Diagnosis not present

## 2022-09-03 DIAGNOSIS — K644 Residual hemorrhoidal skin tags: Secondary | ICD-10-CM | POA: Diagnosis not present

## 2022-09-03 DIAGNOSIS — K641 Second degree hemorrhoids: Secondary | ICD-10-CM | POA: Diagnosis not present

## 2022-09-24 DIAGNOSIS — Z131 Encounter for screening for diabetes mellitus: Secondary | ICD-10-CM | POA: Diagnosis not present

## 2022-09-24 DIAGNOSIS — R946 Abnormal results of thyroid function studies: Secondary | ICD-10-CM | POA: Diagnosis not present

## 2022-09-24 DIAGNOSIS — I10 Essential (primary) hypertension: Secondary | ICD-10-CM | POA: Diagnosis not present

## 2022-09-24 DIAGNOSIS — Z13220 Encounter for screening for lipoid disorders: Secondary | ICD-10-CM | POA: Diagnosis not present

## 2022-09-25 DIAGNOSIS — R300 Dysuria: Secondary | ICD-10-CM | POA: Diagnosis not present

## 2022-09-25 DIAGNOSIS — N390 Urinary tract infection, site not specified: Secondary | ICD-10-CM | POA: Diagnosis not present

## 2022-10-01 DIAGNOSIS — N951 Menopausal and female climacteric states: Secondary | ICD-10-CM | POA: Diagnosis not present

## 2022-10-01 DIAGNOSIS — K641 Second degree hemorrhoids: Secondary | ICD-10-CM | POA: Diagnosis not present

## 2022-10-01 DIAGNOSIS — Z1211 Encounter for screening for malignant neoplasm of colon: Secondary | ICD-10-CM | POA: Diagnosis not present

## 2022-10-01 DIAGNOSIS — E782 Mixed hyperlipidemia: Secondary | ICD-10-CM | POA: Diagnosis not present

## 2022-10-01 DIAGNOSIS — R7303 Prediabetes: Secondary | ICD-10-CM | POA: Diagnosis not present

## 2022-10-01 DIAGNOSIS — K219 Gastro-esophageal reflux disease without esophagitis: Secondary | ICD-10-CM | POA: Diagnosis not present

## 2022-10-01 DIAGNOSIS — Z0000 Encounter for general adult medical examination without abnormal findings: Secondary | ICD-10-CM | POA: Diagnosis not present

## 2022-10-01 DIAGNOSIS — I10 Essential (primary) hypertension: Secondary | ICD-10-CM | POA: Diagnosis not present

## 2022-10-01 DIAGNOSIS — F419 Anxiety disorder, unspecified: Secondary | ICD-10-CM | POA: Diagnosis not present

## 2022-10-01 DIAGNOSIS — A6000 Herpesviral infection of urogenital system, unspecified: Secondary | ICD-10-CM | POA: Diagnosis not present

## 2022-10-01 DIAGNOSIS — R7989 Other specified abnormal findings of blood chemistry: Secondary | ICD-10-CM | POA: Diagnosis not present

## 2022-10-01 DIAGNOSIS — F32A Depression, unspecified: Secondary | ICD-10-CM | POA: Diagnosis not present

## 2022-11-26 DIAGNOSIS — R946 Abnormal results of thyroid function studies: Secondary | ICD-10-CM | POA: Diagnosis not present

## 2022-12-02 ENCOUNTER — Ambulatory Visit: Payer: Self-pay | Admitting: Surgery

## 2022-12-02 DIAGNOSIS — K641 Second degree hemorrhoids: Secondary | ICD-10-CM | POA: Diagnosis not present

## 2022-12-02 NOTE — H&P (Addendum)
Subjective:  CC: Grade II hemorrhoids [K64.1]   HPI:  Danielle Andrews is a 47 y.o. female who presents for above. Symptoms were first noted several years ago. she has some rectal bleeding occurring couple weeks ago, prompting visit to ED, dx with thrombosed hemorrhoids.   Still has persistent hemorrhoids at primary care f/u.  Still continues to have some bleeding.     Past Medical History:  has a past medical history of Anxiety and depression (04/27/2019), Eczema, unspecified, GERD (gastroesophageal reflux disease), Hemorrhoids, Hyperlipidemia, and Hypertension.  Past Surgical History:  has a past surgical history that includes Cesarean section and cesarean delivery.  Family History: family history includes ADD / ADHD in her son; Alcohol abuse in her father; Cancer in her maternal grandmother; Diabetes type II in her mother; Drug abuse in her brother; ESRD-Dialysis in her maternal uncle; Emphysema in her father; Gastric bypass surgery in her mother; High blood pressure (Hypertension) in her father, mother, and sister; Hyperlipidemia (Elevated cholesterol) in her father and mother; Kidney disease in her mother; Obesity in her mother; Stroke in her paternal grandfather.  Social History:  reports that she has never smoked. She has never used smokeless tobacco. She reports current alcohol use. She reports that she does not use drugs.  Current Medications: has a current medication list which includes the following prescription(s): lisinopril, mv-min/iron/folic/calcium/vitk, omeprazole, sertraline, and valacyclovir.  Allergies:  Allergies as of 12/02/2022 - Reviewed 12/02/2022  Allergen Reaction Noted   Alphonsus Sias [allergen ex-venom-wasp protein] Other (See Comments) 04/26/2019    ROS:  A 15 point review of systems was performed and pertinent positives and negatives noted in HPI  Objective:     BP (!) 138/91   Pulse 73   Ht 164.6 cm (5' 4.8")   Wt 89.5 kg (197 lb 5 oz)   BMI 33.03 kg/m    Constitutional :  No distress, cooperative, alert  Lymphatics/Throat:  Supple with no lymphadenopathy  Respiratory:  Clear to auscultation bilaterally  Cardiovascular:  Regular rate and rhythm  Gastrointestinal: Soft, non-tender, non-distended, no organomegaly.  Musculoskeletal: Steady gait and movement  Skin: Cool and moist  Psychiatric: Normal affect, non-agitated, not confused  Rectal: Chaperone present for exam.  External exam noted to have several external hemorrhoids, with one larger one that is non-tender, no ulceration.  DRE revealed, normal rectal tone, with palpable internal hemorrhoid noted at LL portion with minimal discomfort.  After obtaining verbal consent, an anoscope was inserted after prepped with lubricant and internal hemorrhoids noted on DRE confirmed visually, with no evidence of thrombosis. No other pathology such as fissures, fistulas, polyps noted.  Scope withdrawn and patient tolerate procedure well.       LABS:  N/A   RADS: N/A  Assessment:     Grade II hemorrhoids [K64.1]  Plan:    1. Grade II hemorrhoids [K64.1] Discussed risks/benefits/alternatives to surgery.  Alternatives include the options of observation, medical management.  Benefits include symptomatic relief.  I discussed  in detail and the complications related to the operation and the anesthesia, including bleeding, infection, recurrence, remote possibility of temporary or permanent fecal incontinence, poor/delayed wound healing, chronic pain, and additional procedures to address said risks. The risks of general anesthetic, if used, includes MI, CVA, sudden death or even reaction to anesthetic medications also discussed.   We also discussed typical post operative recovery which includes weeks to potentially months of anal pain, drainage, occasional bleeding, and sense of fecal urgency.    ED return precautions given for  sudden increase in pain, bleeding, with possible accompanying fever, nausea,  and/or vomiting.  The patient understands the risks, any and all questions were answered to the patient's satisfaction.  Scheduled for surgery  labs/images/medications/previous chart entries reviewed personally and relevant changes/updates noted above.

## 2022-12-02 NOTE — H&P (View-Only) (Signed)
Subjective:  CC: Grade II hemorrhoids [K64.1]   HPI:  Danielle Andrews is a 47 y.o. female who was referrred by Bentley Haralson, DO for above. Symptoms were first noted several years ago. she has some rectal bleeding occurring couple weeks ago, prompting visit to ED, dx with thrombosed hemorrhoids.   Still has persistent hemorrhoids at primary care f/u.  Still continues to have some bleeding.     Past Medical History:  has a past medical history of Anxiety and depression (04/27/2019), Eczema, unspecified, GERD (gastroesophageal reflux disease), Hemorrhoids, Hyperlipidemia, and Hypertension.  Past Surgical History:  has a past surgical history that includes Cesarean section and cesarean delivery.  Family History: family history includes ADD / ADHD in her son; Alcohol abuse in her father; Cancer in her maternal grandmother; Diabetes type II in her mother; Drug abuse in her brother; ESRD-Dialysis in her maternal uncle; Emphysema in her father; Gastric bypass surgery in her mother; High blood pressure (Hypertension) in her father, mother, and sister; Hyperlipidemia (Elevated cholesterol) in her father and mother; Kidney disease in her mother; Obesity in her mother; Stroke in her paternal grandfather.  Social History:  reports that she has never smoked. She has never used smokeless tobacco. She reports current alcohol use. She reports that she does not use drugs.  Current Medications: has a current medication list which includes the following prescription(s): lisinopril, mv-min/iron/folic/calcium/vitk, omeprazole, sertraline, and valacyclovir.  Allergies:  Allergies as of 12/02/2022 - Reviewed 12/02/2022  Allergen Reaction Noted   Bee [allergen ex-venom-wasp protein] Other (See Comments) 04/26/2019    ROS:  A 15 point review of systems was performed and pertinent positives and negatives noted in HPI  Objective:     BP (!) 138/91   Pulse 73   Ht 164.6 cm (5' 4.8")   Wt 89.5 kg (197 lb 5 oz)    BMI 33.03 kg/m   Constitutional :  No distress, cooperative, alert  Lymphatics/Throat:  Supple with no lymphadenopathy  Respiratory:  Clear to auscultation bilaterally  Cardiovascular:  Regular rate and rhythm  Gastrointestinal: Soft, non-tender, non-distended, no organomegaly.  Musculoskeletal: Steady gait and movement  Skin: Cool and moist  Psychiatric: Normal affect, non-agitated, not confused  Rectal: Chaperone present for exam.  External exam noted to have several external hemorrhoids, with one larger one that is non-tender, no ulceration.  DRE revealed, normal rectal tone, with palpable internal hemorrhoid noted at LL portion with minimal discomfort.  After obtaining verbal consent, an anoscope was inserted after prepped with lubricant and internal hemorrhoids noted on DRE confirmed visually, with no evidence of thrombosis. No other pathology such as fissures, fistulas, polyps noted.  Scope withdrawn and patient tolerate procedure well.       LABS:  N/A   RADS: N/A  Assessment:     Grade II hemorrhoids [K64.1]  Plan:    1. Grade II hemorrhoids [K64.1] Discussed risks/benefits/alternatives to surgery.  Alternatives include the options of observation, medical management.  Benefits include symptomatic relief.  I discussed  in detail and the complications related to the operation and the anesthesia, including bleeding, infection, recurrence, remote possibility of temporary or permanent fecal incontinence, poor/delayed wound healing, chronic pain, and additional procedures to address said risks. The risks of general anesthetic, if used, includes MI, CVA, sudden death or even reaction to anesthetic medications also discussed.   We also discussed typical post operative recovery which includes weeks to potentially months of anal pain, drainage, occasional bleeding, and sense of fecal urgency.      ED return precautions given for sudden increase in pain, bleeding, with possible  accompanying fever, nausea, and/or vomiting.  The patient understands the risks, any and all questions were answered to the patient's satisfaction.  Scheduled for surgery  labs/images/medications/previous chart entries reviewed personally and relevant changes/updates noted above. 

## 2022-12-02 NOTE — H&P (View-Only) (Signed)
Subjective:  CC: Grade II hemorrhoids [K64.1]   HPI:  Danielle Andrews is a 47 y.o. female who presents for above. Symptoms were first noted several years ago. she has some rectal bleeding occurring couple weeks ago, prompting visit to ED, dx with thrombosed hemorrhoids.   Still has persistent hemorrhoids at primary care f/u.  Still continues to have some bleeding.     Past Medical History:  has a past medical history of Anxiety and depression (04/27/2019), Eczema, unspecified, GERD (gastroesophageal reflux disease), Hemorrhoids, Hyperlipidemia, and Hypertension.  Past Surgical History:  has a past surgical history that includes Cesarean section and cesarean delivery.  Family History: family history includes ADD / ADHD in her son; Alcohol abuse in her father; Cancer in her maternal grandmother; Diabetes type II in her mother; Drug abuse in her brother; ESRD-Dialysis in her maternal uncle; Emphysema in her father; Gastric bypass surgery in her mother; High blood pressure (Hypertension) in her father, mother, and sister; Hyperlipidemia (Elevated cholesterol) in her father and mother; Kidney disease in her mother; Obesity in her mother; Stroke in her paternal grandfather.  Social History:  reports that she has never smoked. She has never used smokeless tobacco. She reports current alcohol use. She reports that she does not use drugs.  Current Medications: has a current medication list which includes the following prescription(s): lisinopril, mv-min/iron/folic/calcium/vitk, omeprazole, sertraline, and valacyclovir.  Allergies:  Allergies as of 12/02/2022 - Reviewed 12/02/2022  Allergen Reaction Noted   Bee [allergen ex-venom-wasp protein] Other (See Comments) 04/26/2019    ROS:  A 15 point review of systems was performed and pertinent positives and negatives noted in HPI  Objective:     BP (!) 138/91   Pulse 73   Ht 164.6 cm (5' 4.8")   Wt 89.5 kg (197 lb 5 oz)   BMI 33.03 kg/m    Constitutional :  No distress, cooperative, alert  Lymphatics/Throat:  Supple with no lymphadenopathy  Respiratory:  Clear to auscultation bilaterally  Cardiovascular:  Regular rate and rhythm  Gastrointestinal: Soft, non-tender, non-distended, no organomegaly.  Musculoskeletal: Steady gait and movement  Skin: Cool and moist  Psychiatric: Normal affect, non-agitated, not confused  Rectal: Chaperone present for exam.  External exam noted to have several external hemorrhoids, with one larger one that is non-tender, no ulceration.  DRE revealed, normal rectal tone, with palpable internal hemorrhoid noted at LL portion with minimal discomfort.  After obtaining verbal consent, an anoscope was inserted after prepped with lubricant and internal hemorrhoids noted on DRE confirmed visually, with no evidence of thrombosis. No other pathology such as fissures, fistulas, polyps noted.  Scope withdrawn and patient tolerate procedure well.       LABS:  N/A   RADS: N/A  Assessment:     Grade II hemorrhoids [K64.1]  Plan:    1. Grade II hemorrhoids [K64.1] Discussed risks/benefits/alternatives to surgery.  Alternatives include the options of observation, medical management.  Benefits include symptomatic relief.  I discussed  in detail and the complications related to the operation and the anesthesia, including bleeding, infection, recurrence, remote possibility of temporary or permanent fecal incontinence, poor/delayed wound healing, chronic pain, and additional procedures to address said risks. The risks of general anesthetic, if used, includes MI, CVA, sudden death or even reaction to anesthetic medications also discussed.   We also discussed typical post operative recovery which includes weeks to potentially months of anal pain, drainage, occasional bleeding, and sense of fecal urgency.    ED return precautions given for   sudden increase in pain, bleeding, with possible accompanying fever, nausea,  and/or vomiting.  The patient understands the risks, any and all questions were answered to the patient's satisfaction.  Scheduled for surgery  labs/images/medications/previous chart entries reviewed personally and relevant changes/updates noted above. 

## 2022-12-17 ENCOUNTER — Encounter
Admission: RE | Admit: 2022-12-17 | Discharge: 2022-12-17 | Disposition: A | Discharge status: Home / Self Care | Payer: 59 | Source: Ambulatory Visit | Attending: Surgery | Admitting: Surgery

## 2022-12-17 VITALS — Ht 65.5 in | Wt 193.0 lb

## 2022-12-17 DIAGNOSIS — I10 Essential (primary) hypertension: Secondary | ICD-10-CM

## 2022-12-17 DIAGNOSIS — Z01812 Encounter for preprocedural laboratory examination: Secondary | ICD-10-CM

## 2022-12-17 HISTORY — DX: Gastro-esophageal reflux disease without esophagitis: K21.9

## 2022-12-17 HISTORY — DX: Methicillin resistant Staphylococcus aureus infection, unspecified site: A49.02

## 2022-12-17 NOTE — Patient Instructions (Addendum)
Your procedure is scheduled on: Thursday, June 6 Report to the Registration Desk on the 1st floor of the [facility] To find out your arrival time, please call [phone] between 1PM - 3PM on: Wednesday, June 5 If your arrival time is 6:00 am, do not arrive before that time as the [facility] entrance doors do not open until 6:00 am.  REMEMBER: Instructions that are not followed completely may result in serious medical risk, up to and including death; or upon the discretion of your surgeon and anesthesiologist your surgery may need to be rescheduled.  Do not eat food after midnight the night before surgery.  No gum chewing or hard candies.  You may however, drink CLEAR liquids up to 2 hours before you are scheduled to arrive for your surgery. Do not drink anything within 2 hours of your scheduled arrival time.  Clear liquids include: - water  - apple juice without pulp - gatorade (not RED colors) - black coffee or tea (Do NOT add milk or creamers to the coffee or tea) Do NOT drink anything that is not on this list.  One week prior to surgery: starting May 30 Stop Anti-inflammatories (NSAIDS) such as Advil, Aleve, Ibuprofen, Motrin, Naproxen, Naprosyn and Aspirin based products such as Excedrin, Goody's Powder, BC Powder. Stop ANY OVER THE COUNTER supplements until after surgery. Stop vitamins,liver detox,B complex You may however, continue to take Tylenol if needed for pain up until the day of surgery.  Continue taking all prescribed medications   TAKE ONLY THESE MEDICATIONS THE MORNING OF SURGERY WITH A SIP OF WATER:  Sertraline Omeprazole - (take one the night before and one on the morning of surgery - helps to prevent nausea after surgery.)  No Alcohol for 24 hours before or after surgery.  No Smoking including e-cigarettes for 24 hours before surgery.  No chewable tobacco products for at least 6 hours before surgery.  No nicotine patches on the day of surgery.  Do not  use any "recreational" drugs for at least a week (preferably 2 weeks) before your surgery.  Please be advised that the combination of cocaine and anesthesia may have negative outcomes, up to and including death. If you test positive for cocaine, your surgery will be cancelled.  On the morning of surgery brush your teeth with toothpaste and water, you may rinse your mouth with mouthwash if you wish. Do not swallow any toothpaste or mouthwash.  Do not wear jewelry, make-up, hairpins, clips or nail polish.  Do not wear lotions, powders, or perfumes.   Do not shave body hair from the neck down 48 hours before surgery.  Contact lenses, hearing aids and dentures may not be worn into surgery.  Do not bring valuables to the hospital. [facility] is not responsible for any missing/lost belongings or valuables.   Notify your doctor if there is any change in your medical condition (cold, fever, infection).  Wear comfortable clothing (specific to your surgery type) to the hospital.  After surgery, you can help prevent lung complications by doing breathing exercises.  Take deep breaths and cough every 1-2 hours. Your doctor may order a device called an Incentive Spirometer to help you take deep breaths.  If you are being discharged the day of surgery, you will not be allowed to drive home. You will need a responsible individual to drive you home and stay with you for 24 hours after surgery.   If you are taking public transportation, you will need to  have a responsible individual with you.  Please call the Pre-admissions Testing Dept. at [phone] if you have any questions about these instructions.  Surgery Visitation Policy:  Patients having surgery or a procedure may have two visitors.  Children under the age of 40 must have an adult with them who is not the patient.

## 2022-12-19 ENCOUNTER — Encounter
Admission: RE | Admit: 2022-12-19 | Discharge: 2022-12-19 | Disposition: A | Discharge status: Home / Self Care | Payer: 59 | Source: Ambulatory Visit | Attending: Surgery | Admitting: Surgery

## 2022-12-19 DIAGNOSIS — Z01810 Encounter for preprocedural cardiovascular examination: Secondary | ICD-10-CM | POA: Insufficient documentation

## 2022-12-19 DIAGNOSIS — Z01812 Encounter for preprocedural laboratory examination: Secondary | ICD-10-CM

## 2022-12-19 DIAGNOSIS — I10 Essential (primary) hypertension: Secondary | ICD-10-CM | POA: Diagnosis not present

## 2022-12-25 MED ORDER — CELECOXIB 200 MG PO CAPS
200.0000 mg | ORAL_CAPSULE | ORAL | Status: AC
  Administered 2022-12-26: 200 mg via ORAL

## 2022-12-25 MED ORDER — CHLORHEXIDINE GLUCONATE CLOTH 2 % EX PADS: 6.0000 | MEDICATED_PAD | Freq: Once | CUTANEOUS | Status: DC

## 2022-12-25 MED ORDER — ACETAMINOPHEN 500 MG PO TABS
1000.0000 mg | ORAL_TABLET | ORAL | Status: AC
  Administered 2022-12-26: 1000 mg via ORAL

## 2022-12-25 MED ORDER — CHLORHEXIDINE GLUCONATE 0.12 % MT SOLN
15.0000 mL | Freq: Once | OROMUCOSAL | Status: AC
  Administered 2022-12-26: 15 mL via OROMUCOSAL

## 2022-12-25 MED ORDER — GABAPENTIN 300 MG PO CAPS
300.0000 mg | ORAL_CAPSULE | ORAL | Status: AC
  Administered 2022-12-26: 300 mg via ORAL

## 2022-12-25 MED ORDER — ORAL CARE MOUTH RINSE: 15.0000 mL | Freq: Once | OROMUCOSAL | Status: AC

## 2022-12-25 MED ORDER — LACTATED RINGERS IV SOLN: INTRAVENOUS | Status: DC

## 2022-12-26 ENCOUNTER — Encounter
Admission: RE | Disposition: A | Discharge status: Home / Self Care | Payer: Self-pay | Source: Home / Self Care | Attending: Surgery

## 2022-12-26 ENCOUNTER — Encounter: Payer: Self-pay | Admitting: Surgery

## 2022-12-26 ENCOUNTER — Ambulatory Visit: Payer: 59 | Admitting: Urgent Care

## 2022-12-26 ENCOUNTER — Other Ambulatory Visit: Payer: Self-pay

## 2022-12-26 ENCOUNTER — Ambulatory Visit
Admission: RE | Admit: 2022-12-26 | Discharge: 2022-12-26 | Disposition: A | Discharge status: Home / Self Care | Payer: 59 | Attending: Surgery | Admitting: Surgery

## 2022-12-26 ENCOUNTER — Ambulatory Visit: Payer: 59 | Admitting: Anesthesiology

## 2022-12-26 DIAGNOSIS — I10 Essential (primary) hypertension: Secondary | ICD-10-CM | POA: Insufficient documentation

## 2022-12-26 DIAGNOSIS — D759 Disease of blood and blood-forming organs, unspecified: Secondary | ICD-10-CM | POA: Diagnosis not present

## 2022-12-26 DIAGNOSIS — K642 Third degree hemorrhoids: Secondary | ICD-10-CM | POA: Diagnosis not present

## 2022-12-26 DIAGNOSIS — D649 Anemia, unspecified: Secondary | ICD-10-CM | POA: Insufficient documentation

## 2022-12-26 DIAGNOSIS — F32A Depression, unspecified: Secondary | ICD-10-CM | POA: Diagnosis not present

## 2022-12-26 DIAGNOSIS — K641 Second degree hemorrhoids: Secondary | ICD-10-CM | POA: Insufficient documentation

## 2022-12-26 DIAGNOSIS — K219 Gastro-esophageal reflux disease without esophagitis: Secondary | ICD-10-CM | POA: Diagnosis not present

## 2022-12-26 DIAGNOSIS — F419 Anxiety disorder, unspecified: Secondary | ICD-10-CM | POA: Insufficient documentation

## 2022-12-26 HISTORY — PX: HEMORRHOID SURGERY: SHX153

## 2022-12-26 SURGERY — HEMORRHOIDECTOMY
Anesthesia: General | Site: Rectum

## 2022-12-26 MED ORDER — FENTANYL CITRATE (PF) 100 MCG/2ML IJ SOLN: 25.0000 ug | INTRAMUSCULAR | Status: DC | PRN

## 2022-12-26 MED ORDER — GABAPENTIN 300 MG PO CAPS
ORAL_CAPSULE | ORAL | Status: AC
  Filled 2022-12-26: qty 1

## 2022-12-26 MED ORDER — FENTANYL CITRATE (PF) 100 MCG/2ML IJ SOLN
INTRAMUSCULAR | Status: DC | PRN
  Administered 2022-12-26 (×2): 25 ug via INTRAVENOUS
  Administered 2022-12-26: 50 ug via INTRAVENOUS

## 2022-12-26 MED ORDER — GELATIN ABSORBABLE 12-7 MM EX MISC
CUTANEOUS | Status: AC
  Filled 2022-12-26: qty 1

## 2022-12-26 MED ORDER — BUPIVACAINE-EPINEPHRINE (PF) 0.5% -1:200000 IJ SOLN
INTRAMUSCULAR | Status: AC
  Filled 2022-12-26: qty 30

## 2022-12-26 MED ORDER — LIDOCAINE 5 % EX OINT: 1.0000 | TOPICAL_OINTMENT | Freq: Three times a day (TID) | CUTANEOUS | 0 refills | Status: AC | PRN

## 2022-12-26 MED ORDER — MIDAZOLAM HCL 2 MG/2ML IJ SOLN
INTRAMUSCULAR | Status: DC | PRN
  Administered 2022-12-26: 2 mg via INTRAVENOUS

## 2022-12-26 MED ORDER — CHLORHEXIDINE GLUCONATE 0.12 % MT SOLN
OROMUCOSAL | Status: AC
  Filled 2022-12-26: qty 15

## 2022-12-26 MED ORDER — DEXAMETHASONE SODIUM PHOSPHATE 10 MG/ML IJ SOLN
INTRAMUSCULAR | Status: AC
  Filled 2022-12-26: qty 1

## 2022-12-26 MED ORDER — OXYCODONE HCL 5 MG/5ML PO SOLN: 5.0000 mg | Freq: Once | ORAL | Status: DC | PRN

## 2022-12-26 MED ORDER — PROPOFOL 10 MG/ML IV BOLUS
INTRAVENOUS | Status: AC
  Filled 2022-12-26: qty 20

## 2022-12-26 MED ORDER — LACTATED RINGERS IV SOLN: INTRAVENOUS | Status: DC

## 2022-12-26 MED ORDER — ONDANSETRON HCL 4 MG/2ML IJ SOLN
INTRAMUSCULAR | Status: AC
  Filled 2022-12-26: qty 2

## 2022-12-26 MED ORDER — LIDOCAINE HCL (PF) 2 % IJ SOLN
INTRAMUSCULAR | Status: AC
  Filled 2022-12-26: qty 5

## 2022-12-26 MED ORDER — CELECOXIB 200 MG PO CAPS
ORAL_CAPSULE | ORAL | Status: AC
  Filled 2022-12-26: qty 1

## 2022-12-26 MED ORDER — BUPIVACAINE LIPOSOME 1.3 % IJ SUSP
INTRAMUSCULAR | Status: AC
  Filled 2022-12-26: qty 20

## 2022-12-26 MED ORDER — ACETAMINOPHEN 325 MG PO TABS: 650.0000 mg | ORAL_TABLET | Freq: Three times a day (TID) | ORAL | 0 refills | Status: AC | PRN

## 2022-12-26 MED ORDER — MIDAZOLAM HCL 2 MG/2ML IJ SOLN
INTRAMUSCULAR | Status: AC
  Filled 2022-12-26: qty 2

## 2022-12-26 MED ORDER — ACETAMINOPHEN 500 MG PO TABS
ORAL_TABLET | ORAL | Status: AC
  Filled 2022-12-26: qty 2

## 2022-12-26 MED ORDER — PROPOFOL 10 MG/ML IV BOLUS
INTRAVENOUS | Status: DC | PRN
  Administered 2022-12-26: 100 mg via INTRAVENOUS
  Administered 2022-12-26 (×2): 50 mg via INTRAVENOUS

## 2022-12-26 MED ORDER — ACETAMINOPHEN 10 MG/ML IV SOLN: 1000.0000 mg | Freq: Once | INTRAVENOUS | Status: DC | PRN

## 2022-12-26 MED ORDER — GELATIN ABSORBABLE 12-7 MM EX MISC
CUTANEOUS | Status: DC | PRN
  Administered 2022-12-26: 1

## 2022-12-26 MED ORDER — DOCUSATE SODIUM 100 MG PO CAPS: 100.0000 mg | ORAL_CAPSULE | Freq: Two times a day (BID) | ORAL | 0 refills | Status: AC | PRN

## 2022-12-26 MED ORDER — FENTANYL CITRATE (PF) 100 MCG/2ML IJ SOLN
INTRAMUSCULAR | Status: AC
  Filled 2022-12-26: qty 2

## 2022-12-26 MED ORDER — OXYCODONE-ACETAMINOPHEN 5-325 MG PO TABS: 1.0000 | ORAL_TABLET | Freq: Three times a day (TID) | ORAL | 0 refills | Status: AC | PRN

## 2022-12-26 MED ORDER — ONDANSETRON HCL 4 MG/2ML IJ SOLN: 4.0000 mg | Freq: Once | INTRAMUSCULAR | Status: DC | PRN

## 2022-12-26 MED ORDER — OXYCODONE HCL 5 MG PO TABS: 5.0000 mg | ORAL_TABLET | Freq: Once | ORAL | Status: DC | PRN

## 2022-12-26 MED ORDER — 0.9 % SODIUM CHLORIDE (POUR BTL) OPTIME
TOPICAL | Status: DC | PRN
  Administered 2022-12-26: 200 mL

## 2022-12-26 MED ORDER — BUPIVACAINE LIPOSOME 1.3 % IJ SUSP
INTRAMUSCULAR | Status: DC | PRN
  Administered 2022-12-26: 20 mL

## 2022-12-26 MED ORDER — DEXAMETHASONE SODIUM PHOSPHATE 10 MG/ML IJ SOLN
INTRAMUSCULAR | Status: DC | PRN
  Administered 2022-12-26: 10 mg via INTRAVENOUS

## 2022-12-26 MED ORDER — ONDANSETRON HCL 4 MG/2ML IJ SOLN
INTRAMUSCULAR | Status: DC | PRN
  Administered 2022-12-26: 4 mg via INTRAVENOUS

## 2022-12-26 MED ORDER — LIDOCAINE HCL (CARDIAC) PF 100 MG/5ML IV SOSY
PREFILLED_SYRINGE | INTRAVENOUS | Status: DC | PRN
  Administered 2022-12-26: 100 mg via INTRAVENOUS

## 2022-12-26 MED ORDER — BUPIVACAINE-EPINEPHRINE (PF) 0.5% -1:200000 IJ SOLN
INTRAMUSCULAR | Status: DC | PRN
  Administered 2022-12-26: 20 mL

## 2022-12-26 SURGICAL SUPPLY — 34 items
BLADE SURG 15 STRL LF DISP TIS (BLADE) ×1 IMPLANT
BLADE SURG 15 STRL SS (BLADE) ×1
BRIEF MESH DISP 2XL (UNDERPADS AND DIAPERS) ×1 IMPLANT
DRAPE PERI LITHO V/GYN (MISCELLANEOUS) ×1 IMPLANT
DRAPE UNDER BUTTOCK W/FLU (DRAPES) ×1 IMPLANT
DRSG GAUZE FLUFF 36X18 (GAUZE/BANDAGES/DRESSINGS) ×1 IMPLANT
ELECT REM PT RETURN 9FT ADLT (ELECTROSURGICAL) ×1
ELECTRODE REM PT RTRN 9FT ADLT (ELECTROSURGICAL) ×1 IMPLANT
GAUZE 4X4 16PLY ~~LOC~~+RFID DBL (SPONGE) ×1 IMPLANT
GLOVE BIOGEL PI IND STRL 7.0 (GLOVE) ×1 IMPLANT
GLOVE SURG SYN 6.5 ES PF (GLOVE) ×1 IMPLANT
GLOVE SURG SYN 6.5 PF PI (GLOVE) ×1 IMPLANT
GOWN STRL REUS W/ TWL LRG LVL3 (GOWN DISPOSABLE) ×2 IMPLANT
GOWN STRL REUS W/TWL LRG LVL3 (GOWN DISPOSABLE) ×2
KIT TURNOVER CYSTO (KITS) ×1 IMPLANT
LABEL OR SOLS (LABEL) ×1 IMPLANT
MANIFOLD NEPTUNE II (INSTRUMENTS) ×1 IMPLANT
NDL HYPO 22X1.5 SAFETY MO (MISCELLANEOUS) ×1 IMPLANT
NDL HYPO 25X1 1.5 SAFETY (NEEDLE) ×1 IMPLANT
NEEDLE HYPO 22X1.5 SAFETY MO (MISCELLANEOUS) ×1 IMPLANT
NEEDLE HYPO 25X1 1.5 SAFETY (NEEDLE) ×1 IMPLANT
NS IRRIG 500ML POUR BTL (IV SOLUTION) ×1 IMPLANT
PACK BASIN MINOR ARMC (MISCELLANEOUS) ×1 IMPLANT
PAD PREP OB/GYN DISP 24X41 (PERSONAL CARE ITEMS) ×1 IMPLANT
SHEARS HARMONIC 9CM CVD (BLADE) IMPLANT
SOL PREP PVP 2OZ (MISCELLANEOUS) ×2
SOLUTION PREP PVP 2OZ (MISCELLANEOUS) ×1 IMPLANT
SURGILUBE 2OZ TUBE FLIPTOP (MISCELLANEOUS) ×1 IMPLANT
SUT VIC AB 3-0 SH 27 (SUTURE) ×1
SUT VIC AB 3-0 SH 27X BRD (SUTURE) ×1 IMPLANT
SWAB CULTURE AMIES ANAERIB BLU (MISCELLANEOUS) IMPLANT
SYR 10ML LL (SYRINGE) ×1 IMPLANT
SYR 20ML LL LF (SYRINGE) ×1 IMPLANT
TRAP FLUID SMOKE EVACUATOR (MISCELLANEOUS) ×1 IMPLANT

## 2022-12-26 NOTE — Interval H&P Note (Signed)
History and Physical Interval Note:  12/26/2022 10:32 AM  Danielle Andrews  has presented today for surgery, with the diagnosis of grade II hemorrhoids K64.1.  The various methods of treatment have been discussed with the patient and family. After consideration of risks, benefits and other options for treatment, the patient has consented to  Procedure(s): HEMORRHOIDECTOMY (N/A) as a surgical intervention.  The patient's history has been reviewed, patient examined, no change in status, stable for surgery.  I have reviewed the patient's chart and labs.  Questions were answered to the patient's satisfaction.     [name]

## 2022-12-26 NOTE — Anesthesia Procedure Notes (Signed)
Procedure Name: LMA Insertion Date/Time: 12/26/2022 11:30 AM  Performed by: Jeannene Patella, CRNAPre-anesthesia Checklist: Patient identified, Patient being monitored, Timeout performed, Emergency Drugs available and Suction available Patient Re-evaluated:Patient Re-evaluated prior to induction Oxygen Delivery Method: Circle system utilized Preoxygenation: Pre-oxygenation with 100% oxygen Induction Type: IV induction Ventilation: Mask ventilation without difficulty LMA: LMA inserted LMA Size: 4.0 Tube type: Oral Number of attempts: 1 Placement Confirmation: positive ETCO2 and breath sounds checked- equal and bilateral Tube secured with: Tape Dental Injury: Teeth and Oropharynx as per pre-operative assessment

## 2022-12-26 NOTE — Transfer of Care (Signed)
Immediate Anesthesia Transfer of Care Note  Patient: Danielle Andrews  Procedure(s) Performed: HEMORRHOIDECTOMY (Rectum)  Patient Location: PACU  Anesthesia Type:General  Level of Consciousness: awake, alert , and oriented  Airway & Oxygen Therapy: Patient Spontanous Breathing  Post-op Assessment: Report given to RN and Post -op Vital signs reviewed and stable  Post vital signs: Reviewed and stable  Last Vitals:  Vitals Value Taken Time  BP 151/89 12/26/22 1225  Temp    Pulse 89 12/26/22 1227  Resp 17 12/26/22 1227  SpO2 98 % 12/26/22 1227  Vitals shown include unvalidated device data.  Last Pain:  Vitals:   12/26/22 1101  TempSrc:   PainSc: 0-No pain      Patients Stated Pain Goal: 0 (12/26/22 1101)  Complications: No notable events documented.

## 2022-12-26 NOTE — Discharge Instructions (Addendum)
Hemorrhoids, Care After This sheet gives you information about how to care for yourself after your procedure. Your health care provider may also give you more specific instructions. If you have problems or questions, contact your health care provider. What can I expect after the procedure? After the procedure, it is common to have: Soreness. Bruising. Itching.  Follow these instructions at home: site care Follow instructions from your health care provider about how to take care of your site. Make sure you: LEAVE packing in place until it falls out on its own.  No need to replace afterwards Leave stitches (sutures), skin glue, or adhesive strips in place.  If the area bleeds or bruises, apply gentle pressure for 10 minutes. OK TO SHOWER IN 24HRS  General instructions Rest and then return to your normal activities as told by your health care provider. tylenol and advil as needed for discomfort.  Please alternate between the two every four hours as needed for pain.    Use narcotics, if prescribed, only when tylenol and motrin is not enough to control pain.  325-650mg every 8hrs to max of 3000mg/24hrs (including the 325mg in every norco dose) for the tylenol.    Advil up to 800mg per dose every 8hrs as needed for pain.   Keep all follow-up visits as told by your health care provider. This is important. Contact a health care provider if you have excessive: redness, swelling, or pain around your site. blood coming from your site. pus or a bad smell coming from your site. You have a fever.  Get help right away if: You have bleeding that does not stop with pressure or a dressing. Summary After the procedure, it is common to have some soreness, bruising, and itching at the site. Follow instructions from your health care provider about how to take care of your site. Keep all follow-up visits as told by your health care provider. This is important. This information is not intended to replace  advice given to you by your health care provider. Make sure you discuss any questions you have with your health care provider. Document Released: 08/04/2015 Document Revised: 01/05/2018 Document Reviewed: 01/05/2018 Elsevier Interactive Patient Education  2019 [employer]  AMBULATORY SURGERY  DISCHARGE INSTRUCTIONS   The drugs that you were given will stay in your system until tomorrow so for the next 24 hours you should not:  Drive an automobile Make any legal decisions Drink any alcoholic beverage   You may resume regular meals tomorrow.  Today it is better to start with liquids and gradually work up to solid foods.  You may eat anything you prefer, but it is better to start with liquids, then soup and crackers, and gradually work up to solid foods.   Please notify your doctor immediately if you have any unusual bleeding, trouble breathing, redness and pain at the surgery site, drainage, fever, or pain not relieved by medication.    Additional Instructions:     Please contact your physician with any problems or Same Day Surgery at [phone], Monday through Friday 6 am to 4 pm, or [place] [facility] number at [phone].  

## 2022-12-26 NOTE — Progress Notes (Signed)
Patient says that she has a colonoscopy scheduled in June. Asked Dr. Tonna Boehringer to make sure this was okay to still do, due to her procedure today. Per Dr. Tonna Boehringer patient is to wait 6 weeks post op to have the procedure done. Patient and sister understand, and info was written on AVS.

## 2022-12-26 NOTE — Anesthesia Preprocedure Evaluation (Addendum)
Anesthesia Evaluation  Patient identified by MRN, date of birth, ID band Patient awake    Reviewed: Allergy & Precautions, NPO status , Patient's Chart, lab work & pertinent test results  History of Anesthesia Complications Negative for: history of anesthetic complications  Airway Mallampati: III   Neck ROM: Full    Dental  (+) Missing   Pulmonary    Pulmonary exam normal breath sounds clear to auscultation       Cardiovascular hypertension, Normal cardiovascular exam Rhythm:Regular Rate:Normal  ECG 12/19/22: normal   Neuro/Psych  Headaches PSYCHIATRIC DISORDERS Anxiety Depression       GI/Hepatic ,GERD  ,,  Endo/Other  Obesity   Renal/GU negative Renal ROS     Musculoskeletal   Abdominal   Peds  Hematology  (+) Blood dyscrasia, anemia   Anesthesia Other Findings   Reproductive/Obstetrics                             Anesthesia Physical Anesthesia Plan  ASA: 2  Anesthesia Plan: General   Post-op Pain Management:    Induction: Intravenous  PONV Risk Score and Plan: 3 and Ondansetron, Dexamethasone and Treatment may vary due to age or medical condition  Airway Management Planned: LMA  Additional Equipment:   Intra-op Plan:   Post-operative Plan: Extubation in OR  Informed Consent: I have reviewed the patients History and Physical, chart, labs and discussed the procedure including the risks, benefits and alternatives for the proposed anesthesia with the patient or authorized representative who has indicated his/her understanding and acceptance.     Dental advisory given  Plan Discussed with: CRNA  Anesthesia Plan Comments: (Patient consented for risks of anesthesia including but not limited to:  - adverse reactions to medications - damage to eyes, teeth, lips or other oral mucosa - nerve damage due to positioning  - sore throat or hoarseness - damage to heart, brain,  nerves, lungs, other parts of body or loss of life  Informed patient about role of CRNA in peri- and intra-operative care.  Patient voiced understanding.)        Anesthesia Quick Evaluation

## 2022-12-26 NOTE — Interval H&P Note (Signed)
History and Physical Interval Note:  12/26/2022 10:31 AM  Danielle Andrews  has presented today for surgery, with the diagnosis of grade II hemorrhoids K64.1.  The various methods of treatment have been discussed with the patient and family. After consideration of risks, benefits and other options for treatment, the patient has consented to  Procedure(s): HEMORRHOIDECTOMY (N/A) as a surgical intervention.  The patient's history has been reviewed, patient examined, no change in status, stable for surgery.  I have reviewed the patient's chart and labs.  Questions were answered to the patient's satisfaction.     Rachell Druckenmiller Tonna Boehringer

## 2022-12-26 NOTE — Anesthesia Postprocedure Evaluation (Signed)
Anesthesia Post Note  Patient: Danielle Andrews  Procedure(s) Performed: HEMORRHOIDECTOMY (Rectum)  Patient location during evaluation: PACU Anesthesia Type: General Level of consciousness: awake and alert, oriented and patient cooperative Pain management: pain level controlled Vital Signs Assessment: post-procedure vital signs reviewed and stable Respiratory status: spontaneous breathing, nonlabored ventilation and respiratory function stable Cardiovascular status: blood pressure returned to baseline and stable Postop Assessment: adequate PO intake Anesthetic complications: no   No notable events documented.   Last Vitals:  Vitals:   12/26/22 1230 12/26/22 1253  BP: (!) 152/94 (!) 149/96  Pulse: 85 70  Resp: 12 15  Temp:    SpO2: 100% 100%    Last Pain:  Vitals:   12/26/22 1253  TempSrc: Temporal  PainSc: 0-No pain                 Reed Breech

## 2022-12-26 NOTE — Op Note (Signed)
Preoperative diagnosis: third degree hemorrhoids.   Postoperative diagnosis: third  degree hemorrhoids.  Procedure: exam under anesthesia, two column hemorrhoidectomy.  Surgeon: Tonna Boehringer  Anesthesia: general  Specimen: hemorrhoids x2  Complications: none  EBL: 15mL  Wound classification: Clean Contaminated  Indications: Patient is a 47 y.o. female was found to have symptomatic hemorrhoids refractory to medical management.   Findings: 1. third  degree hemorrhoids 2. Internal and external anal sphincter palpated and preserved 3. Adequate hemostasis  Description of procedure: The patient was brought to the operating room and general anesthesia was induced. Patient was placed high lithotomy position. A time-out was completed verifying correct patient, procedure, site, positioning, and implant(s) and/or special equipment prior to beginning this procedure. The perineum was prepped and draped in standard sterile fashion. Local anesthetic was injected as a perianal block. An anoscope was introduced and hemorrhoidal pedicles identified.  A harmonic was placed across the base of the right anterior pedicle and excess hemorrhoidal tissue removed.  Specimen was passed off operative field pending pathology.  3-0 Vicryl then used to close the open wound.  A harmonic was placed across the base of the right posterior pedicle and excess hemorrhoidal tissue removed.  Specimen was passed off operative field pending pathology.  3-0 Vicryl then used to close the open wound.  Hemostasis achieved with additional 3-0 vicryl suture in areas of bleeding until all active bleeding controlled.  Last inspection of the anal canal did not note any additional hemorrhoidal tissue and no other pathology. Exparel injected as a perianal block. A gauze pad was tucked between the gluteal folds, and secured in place with mesh underwear.  The patient tolerated the procedure well and was taken to the postanesthesia care unit in  stable condition.  Sponge and instrument count correct at end of procedure.

## 2022-12-27 ENCOUNTER — Encounter: Payer: Self-pay | Admitting: Surgery

## 2023-01-01 LAB — SURGICAL PATHOLOGY

## 2023-03-18 DIAGNOSIS — Z91030 Bee allergy status: Secondary | ICD-10-CM | POA: Diagnosis not present

## 2023-03-18 DIAGNOSIS — T783XXA Angioneurotic edema, initial encounter: Secondary | ICD-10-CM | POA: Diagnosis not present

## 2023-04-15 DIAGNOSIS — E782 Mixed hyperlipidemia: Secondary | ICD-10-CM | POA: Diagnosis not present

## 2023-04-15 DIAGNOSIS — R7303 Prediabetes: Secondary | ICD-10-CM | POA: Diagnosis not present

## 2023-06-17 DIAGNOSIS — F32A Depression, unspecified: Secondary | ICD-10-CM | POA: Diagnosis not present

## 2023-06-17 DIAGNOSIS — N951 Menopausal and female climacteric states: Secondary | ICD-10-CM | POA: Diagnosis not present

## 2023-06-17 DIAGNOSIS — K219 Gastro-esophageal reflux disease without esophagitis: Secondary | ICD-10-CM | POA: Diagnosis not present

## 2023-06-17 DIAGNOSIS — R7989 Other specified abnormal findings of blood chemistry: Secondary | ICD-10-CM | POA: Diagnosis not present

## 2023-06-17 DIAGNOSIS — Z131 Encounter for screening for diabetes mellitus: Secondary | ICD-10-CM | POA: Diagnosis not present

## 2023-06-17 DIAGNOSIS — E782 Mixed hyperlipidemia: Secondary | ICD-10-CM | POA: Diagnosis not present

## 2023-06-17 DIAGNOSIS — K641 Second degree hemorrhoids: Secondary | ICD-10-CM | POA: Diagnosis not present

## 2023-06-17 DIAGNOSIS — F419 Anxiety disorder, unspecified: Secondary | ICD-10-CM | POA: Diagnosis not present

## 2023-06-17 DIAGNOSIS — A6000 Herpesviral infection of urogenital system, unspecified: Secondary | ICD-10-CM | POA: Diagnosis not present

## 2023-06-17 DIAGNOSIS — I10 Essential (primary) hypertension: Secondary | ICD-10-CM | POA: Diagnosis not present

## 2024-04-24 DIAGNOSIS — R300 Dysuria: Secondary | ICD-10-CM | POA: Diagnosis not present

## 2024-04-24 DIAGNOSIS — R350 Frequency of micturition: Secondary | ICD-10-CM | POA: Diagnosis not present

## 2024-04-24 DIAGNOSIS — N3000 Acute cystitis without hematuria: Secondary | ICD-10-CM | POA: Diagnosis not present

## 2024-06-10 DIAGNOSIS — Z1231 Encounter for screening mammogram for malignant neoplasm of breast: Secondary | ICD-10-CM | POA: Diagnosis not present

## 2024-06-10 DIAGNOSIS — R7989 Other specified abnormal findings of blood chemistry: Secondary | ICD-10-CM | POA: Diagnosis not present

## 2024-06-10 DIAGNOSIS — A6000 Herpesviral infection of urogenital system, unspecified: Secondary | ICD-10-CM | POA: Diagnosis not present

## 2024-06-10 DIAGNOSIS — Z0000 Encounter for general adult medical examination without abnormal findings: Secondary | ICD-10-CM | POA: Diagnosis not present

## 2024-06-10 DIAGNOSIS — E782 Mixed hyperlipidemia: Secondary | ICD-10-CM | POA: Diagnosis not present

## 2024-06-10 DIAGNOSIS — F419 Anxiety disorder, unspecified: Secondary | ICD-10-CM | POA: Diagnosis not present

## 2024-06-10 DIAGNOSIS — Z1331 Encounter for screening for depression: Secondary | ICD-10-CM | POA: Diagnosis not present

## 2024-06-10 DIAGNOSIS — K641 Second degree hemorrhoids: Secondary | ICD-10-CM | POA: Diagnosis not present

## 2024-06-10 DIAGNOSIS — K219 Gastro-esophageal reflux disease without esophagitis: Secondary | ICD-10-CM | POA: Diagnosis not present

## 2024-06-10 DIAGNOSIS — I10 Essential (primary) hypertension: Secondary | ICD-10-CM | POA: Diagnosis not present

## 2024-06-10 DIAGNOSIS — N951 Menopausal and female climacteric states: Secondary | ICD-10-CM | POA: Diagnosis not present

## 2024-06-10 DIAGNOSIS — Z131 Encounter for screening for diabetes mellitus: Secondary | ICD-10-CM | POA: Diagnosis not present

## 2024-06-15 DIAGNOSIS — R3915 Urgency of urination: Secondary | ICD-10-CM | POA: Diagnosis not present

## 2024-06-15 DIAGNOSIS — N390 Urinary tract infection, site not specified: Secondary | ICD-10-CM | POA: Diagnosis not present

## 2024-06-15 DIAGNOSIS — R300 Dysuria: Secondary | ICD-10-CM | POA: Diagnosis not present

## 2024-06-15 DIAGNOSIS — R8290 Unspecified abnormal findings in urine: Secondary | ICD-10-CM | POA: Diagnosis not present
